# Patient Record
Sex: Male | Born: 1960
Health system: Southern US, Community
[De-identification: ages and names within clinical notes are randomized; demographics above are authoritative.]

## PROBLEM LIST (undated history)

## (undated) DIAGNOSIS — K219 Gastro-esophageal reflux disease without esophagitis: Secondary | ICD-10-CM

## (undated) DIAGNOSIS — Z8719 Personal history of other diseases of the digestive system: Secondary | ICD-10-CM

## (undated) DIAGNOSIS — E785 Hyperlipidemia, unspecified: Secondary | ICD-10-CM

## (undated) DIAGNOSIS — N200 Calculus of kidney: Secondary | ICD-10-CM

## (undated) DIAGNOSIS — K5790 Diverticulosis of intestine, part unspecified, without perforation or abscess without bleeding: Secondary | ICD-10-CM

## (undated) DIAGNOSIS — Z789 Other specified health status: Secondary | ICD-10-CM

## (undated) DIAGNOSIS — I10 Essential (primary) hypertension: Secondary | ICD-10-CM

## (undated) DIAGNOSIS — M109 Gout, unspecified: Secondary | ICD-10-CM

## (undated) DIAGNOSIS — N400 Enlarged prostate without lower urinary tract symptoms: Secondary | ICD-10-CM

## (undated) HISTORY — DX: Gastro-esophageal reflux disease without esophagitis: K21.9

## (undated) HISTORY — DX: Essential (primary) hypertension: I10

## (undated) HISTORY — PX: WRIST GANGLION EXCISION: SUR520

## (undated) HISTORY — DX: Personal history of other diseases of the digestive system: Z87.19

## (undated) HISTORY — DX: Benign prostatic hyperplasia without lower urinary tract symptoms: N40.0

## (undated) HISTORY — DX: Hyperlipidemia, unspecified: E78.5

## (undated) HISTORY — DX: Diverticulosis of intestine, part unspecified, without perforation or abscess without bleeding: K57.90

## (undated) HISTORY — DX: Gout, unspecified: M10.9

## (undated) HISTORY — DX: Calculus of kidney: N20.0

---

## 1992-08-16 HISTORY — PX: CYSTECTOMY: SUR359

## 1996-08-16 DIAGNOSIS — M109 Gout, unspecified: Secondary | ICD-10-CM

## 1996-08-16 DIAGNOSIS — Z8719 Personal history of other diseases of the digestive system: Secondary | ICD-10-CM

## 1996-08-16 HISTORY — DX: Personal history of other diseases of the digestive system: Z87.19

## 1996-08-16 HISTORY — DX: Gout, unspecified: M10.9

## 1997-08-16 HISTORY — PX: LITHOTRIPSY: SUR834

## 1997-10-24 ENCOUNTER — Encounter: Payer: Self-pay | Admitting: Family Medicine

## 1997-10-25 ENCOUNTER — Encounter: Payer: Self-pay | Admitting: Family Medicine

## 1997-10-25 LAB — CONVERTED CEMR LAB
Blood Glucose, Fasting: 85 mg/dL
WBC, blood: 6.8 10*3/uL

## 1998-12-15 DIAGNOSIS — E785 Hyperlipidemia, unspecified: Secondary | ICD-10-CM

## 1998-12-15 HISTORY — DX: Hyperlipidemia, unspecified: E78.5

## 1998-12-19 ENCOUNTER — Encounter: Payer: Self-pay | Admitting: Family Medicine

## 2000-04-07 ENCOUNTER — Encounter: Payer: Self-pay | Admitting: Family Medicine

## 2000-04-07 LAB — CONVERTED CEMR LAB: Blood Glucose, Fasting: 92 mg/dL

## 2001-05-04 ENCOUNTER — Encounter: Payer: Self-pay | Admitting: Family Medicine

## 2001-05-04 LAB — CONVERTED CEMR LAB
RBC count: 5.08 10*6/uL
WBC, blood: 7.8 10*3/uL

## 2003-04-09 ENCOUNTER — Encounter: Payer: Self-pay | Admitting: Family Medicine

## 2003-04-09 LAB — CONVERTED CEMR LAB: Blood Glucose, Fasting: 95 mg/dL

## 2004-05-25 ENCOUNTER — Encounter: Payer: Self-pay | Admitting: Family Medicine

## 2005-07-30 ENCOUNTER — Ambulatory Visit: Payer: Self-pay | Admitting: Family Medicine

## 2005-09-06 ENCOUNTER — Ambulatory Visit: Payer: Self-pay | Admitting: Family Medicine

## 2005-09-10 ENCOUNTER — Ambulatory Visit: Payer: Self-pay | Admitting: Family Medicine

## 2006-07-28 ENCOUNTER — Ambulatory Visit: Payer: Self-pay | Admitting: Family Medicine

## 2006-11-22 ENCOUNTER — Ambulatory Visit: Payer: Self-pay | Admitting: Family Medicine

## 2006-11-22 LAB — CONVERTED CEMR LAB
AST: 16 units/L (ref 0–37)
Alkaline Phosphatase: 44 units/L (ref 39–117)
CO2: 33 meq/L — ABNORMAL HIGH (ref 19–32)
Chloride: 110 meq/L (ref 96–112)
Creatinine, Ser: 1 mg/dL (ref 0.4–1.5)
HDL: 39.6 mg/dL (ref >39.0)
Potassium: 4.7 meq/L (ref 3.5–5.1)
Sodium: 148 meq/L — ABNORMAL HIGH (ref 135–145)
TSH: 1.1 microintl units/mL (ref 0.35–5.50)
Total Bilirubin: 1 mg/dL (ref 0.3–1.2)
Total Protein: 6.4 g/dL (ref 6.0–8.3)
Triglycerides: 127 mg/dL (ref 0–149)
Uric Acid, Serum: 4.6 mg/dL (ref 2.4–7.0)

## 2006-11-23 ENCOUNTER — Ambulatory Visit: Payer: Self-pay | Admitting: Family Medicine

## 2007-08-23 ENCOUNTER — Ambulatory Visit: Payer: Self-pay | Admitting: Family Medicine

## 2007-08-23 DIAGNOSIS — K219 Gastro-esophageal reflux disease without esophagitis: Secondary | ICD-10-CM

## 2007-08-23 DIAGNOSIS — K449 Diaphragmatic hernia without obstruction or gangrene: Secondary | ICD-10-CM

## 2007-08-28 ENCOUNTER — Encounter: Payer: Self-pay | Admitting: Family Medicine

## 2007-08-28 DIAGNOSIS — M109 Gout, unspecified: Secondary | ICD-10-CM

## 2007-08-28 DIAGNOSIS — L259 Unspecified contact dermatitis, unspecified cause: Secondary | ICD-10-CM

## 2007-08-28 DIAGNOSIS — E785 Hyperlipidemia, unspecified: Secondary | ICD-10-CM

## 2007-08-28 DIAGNOSIS — R7309 Other abnormal glucose: Secondary | ICD-10-CM | POA: Insufficient documentation

## 2008-05-29 ENCOUNTER — Ambulatory Visit: Payer: Self-pay | Admitting: Family Medicine

## 2008-05-29 LAB — CONVERTED CEMR LAB
Bilirubin, Direct: 0.1 mg/dL (ref 0.0–0.3)
CO2: 32 meq/L (ref 19–32)
Calcium: 9 mg/dL (ref 8.4–10.5)
Creatinine, Ser: 0.8 mg/dL (ref 0.4–1.5)
Creatinine,U: 212.5 mg/dL
GFR calc Af Amer: 133 mL/min
GFR calc non Af Amer: 110 mL/min
HDL: 35.6 mg/dL — ABNORMAL LOW (ref >39.0)
LDL Cholesterol: 106 mg/dL — ABNORMAL HIGH (ref 0–99)
Microalb Creat Ratio: 3.3 mg/g (ref 0.0–30.0)
Microalb, Ur: 0.7 mg/dL (ref 0.0–1.9)
Sodium: 144 meq/L (ref 135–145)
TSH: 0.83 microintl units/mL (ref 0.35–5.50)
Total Bilirubin: 0.8 mg/dL (ref 0.3–1.2)
Total CHOL/HDL Ratio: 4.5
Triglycerides: 99 mg/dL (ref 0–149)
Uric Acid, Serum: 4.4 mg/dL (ref 4.0–7.8)

## 2008-06-03 ENCOUNTER — Ambulatory Visit: Payer: Self-pay | Admitting: Family Medicine

## 2009-08-18 ENCOUNTER — Ambulatory Visit: Payer: Self-pay | Admitting: Family Medicine

## 2010-03-24 ENCOUNTER — Encounter (INDEPENDENT_AMBULATORY_CARE_PROVIDER_SITE_OTHER): Payer: Self-pay | Admitting: *Deleted

## 2010-09-17 NOTE — Letter (Signed)
Summary: Nadara Eaton letter  [place] at [facility]  [address] [place], [place] [address]   Phone: [phone]  Fax: [phone]       03/24/2010 MRN: [id number]  Timothy Landry [address] [place], [place]  [address]  Dear Mr. [facility] [place], and [facility] announce the retirement of Arta Silence, M.D., from full-time practice at the [facility] office effective February 12, 2010 and his plans of returning part-time.  It is important to Dr. Hetty Ely and to our practice that you understand that [facility] has seven physicians in our office for your health care needs.  We will continue to offer the same exceptional care that you have today.    Dr. Hetty Ely has spoken to many of you about his plans for retirement and returning part-time in the fall.   We will continue to work with you through the transition to schedule appointments for you in the office and meet the high standards that [place] is committed to.   Again, it is with great pleasure that we share the news that Dr. Hetty Ely will return to [facility] in October of 2011 with a reduced schedule.    If you have any questions, or would like to request an appointment with one of our physicians, please call us at [phone] and press the option for Scheduling an appointment.  We take pleasure in providing you with excellent patient care and look forward to seeing you at your next office visit.  Our [facility] Physicians are:  Tillman Abide, M.D. Laurita Quint, M.D. Roxy Manns, M.D. Kerby Nora, M.D. Hannah Beat, M.D. Ruthe Mannan, M.D. We proudly welcomed Raechel Ache, M.D. and Eustaquio Boyden, M.D. to the practice in July/August 2011.  Sincerely,  [facility]

## 2010-09-17 NOTE — Assessment & Plan Note (Signed)
Summary: MEDICATION REFILL CYD   Vital Signs:  Patient profile:   50 year old male Weight:      212 pounds BMI:     31.42 Temp:     98.3 degrees F oral Pulse rate:   60 / minute Pulse rhythm:   regular BP sitting:   120 / 100  (left arm) Cuff size:   regular  Vitals Entered By: Linde Gillis CMA Duncan Dull) (August 18, 2009 2:12 PM) CC: medication refill   History of Present Illness: Timothy Landry is a 50 y/o caucasian man who presents today for a refill of Allopurinol.  He states that his gout is well controlled and that he has had no recent episodes.  No complaints with any side effects from medications and no other medical complaints. He has not had Comp Exam in over aa year. He feels well.  Problems Prior to Update: 1)  Health Maintenance Exam  (ICD-V70.0) 2)  Hyperglycemia  (ICD-790.29) 3)  Ganglion Cyst, Wrist, Left Removed  (ICD-727.41) 4)  Eczema  (ICD-692.9) 5)  Hyperlipidemia  (ICD-272.4) 6)  Gout  (ICD-274.9) 7)  Hiatal Hernia With Reflux  (ICD-553.3)  Medications Prior to Update: 1)  Protonix 40 Mg  Tbec (Pantoprazole Sodium) .Marland Kitchen.. 1 Daily By Mouth 2)  Allopurinol 300 Mg  Tabs (Allopurinol) .Marland Kitchen.. 1 Daily By Mouth  Allergies (verified): No Known Drug Allergies  Physical Exam  General:  Well-developed,well-nourished,in no acute distress; alert,appropriate and cooperative throughout examination Head:  Normocephalic and atraumatic without obvious abnormalities. No apparent alopecia or balding. Eyes:  Conjunctiva clear bilaterally.  Ears:  External ear exam shows no significant lesions or deformities.  Otoscopic examination reveals clear canals, tympanic membranes are intact bilaterally without bulging, retraction, inflammation or discharge. Hearing is grossly normal bilaterally. Nose:  External nasal examination shows no deformity or inflammation. Nasal mucosa are pink and moist without lesions or exudates. Mouth:  Oral mucosa and oropharynx without lesions or exudates.   Teeth in good repair. Lungs:  Normal respiratory effort, chest expands symmetrically. Lungs are clear to auscultation, no crackles or wheezes. Heart:  Normal rate and regular rhythm. S1 and S2 normal without gallop, murmur, click, rub or other extra sounds. Extremities:  No clubbing, cyanosis, edema, or deformity noted with normal full range of motion of all joints.     Impression & Recommendations:  Problem # 1:  GOUT (ICD-274.9) Assessment Unchanged  Stable. Will recheck U>A in the future. His updated medication list for this problem includes:    Allopurinol 300 Mg Tabs (Allopurinol) .Marland Kitchen... 1 daily by mouth  Elevate extremity; warm compresses, symptomatic relief and medication as directed.   Complete Medication List: 1)  Allopurinol 300 Mg Tabs (Allopurinol) .Marland Kitchen.. 1 daily by mouth  Patient Instructions: 1)  RTC for Comp Exam prior to 01/2010, labs prior. Prescriptions: ALLOPURINOL 300 MG  TABS (ALLOPURINOL) 1 DAILY BY MOUTH  #90 x 4   Entered and Authorized by:   Shaune Leeks MD   Signed by:   Shaune Leeks MD on 08/18/2009   Method used:   Print then Give to Patient   RxID:   [id number]   Current Allergies (reviewed today): No known allergies

## 2010-11-23 ENCOUNTER — Other Ambulatory Visit: Payer: Self-pay | Admitting: *Deleted

## 2010-11-23 MED ORDER — ALLOPURINOL 300 MG PO TABS: 300.0000 mg | ORAL_TABLET | Freq: Every day | ORAL | Status: DC

## 2010-12-21 ENCOUNTER — Other Ambulatory Visit: Payer: Self-pay | Admitting: Family Medicine

## 2011-01-12 ENCOUNTER — Other Ambulatory Visit: Payer: Self-pay | Admitting: *Deleted

## 2011-01-12 MED ORDER — ALLOPURINOL 300 MG PO TABS: 300.0000 mg | ORAL_TABLET | Freq: Every day | ORAL | Status: DC

## 2011-01-12 NOTE — Telephone Encounter (Signed)
Will need to be seen before next refill. Aug PE will suffice.

## 2011-01-12 NOTE — Telephone Encounter (Signed)
Received faxed refill request from pharmacy to change from 30 day supply to a 90 day supply. Please verify dispense amount and no refills until seen at next appointment which is scheduled in July.

## 2011-01-12 NOTE — Telephone Encounter (Signed)
Will approve 90/0 but not again until seen. If he cancels or no shows, will need to be seen before refill.

## 2011-01-21 ENCOUNTER — Other Ambulatory Visit: Payer: Self-pay | Admitting: Family Medicine

## 2011-01-21 DIAGNOSIS — Z125 Encounter for screening for malignant neoplasm of prostate: Secondary | ICD-10-CM

## 2011-01-21 DIAGNOSIS — E785 Hyperlipidemia, unspecified: Secondary | ICD-10-CM

## 2011-01-21 DIAGNOSIS — M109 Gout, unspecified: Secondary | ICD-10-CM

## 2011-01-21 DIAGNOSIS — R7309 Other abnormal glucose: Secondary | ICD-10-CM

## 2011-02-01 ENCOUNTER — Other Ambulatory Visit (INDEPENDENT_AMBULATORY_CARE_PROVIDER_SITE_OTHER): Payer: BC Managed Care – PPO

## 2011-02-01 DIAGNOSIS — E785 Hyperlipidemia, unspecified: Secondary | ICD-10-CM

## 2011-02-01 DIAGNOSIS — Z125 Encounter for screening for malignant neoplasm of prostate: Secondary | ICD-10-CM

## 2011-02-01 DIAGNOSIS — M109 Gout, unspecified: Secondary | ICD-10-CM

## 2011-02-01 DIAGNOSIS — R7309 Other abnormal glucose: Secondary | ICD-10-CM

## 2011-02-01 LAB — CBC WITH DIFFERENTIAL/PLATELET
Basophils Relative: 0.4 % (ref 0.0–3.0)
Eosinophils Absolute: 0.1 10*3/uL (ref 0.0–0.7)
MCHC: 33.9 g/dL (ref 30.0–36.0)
MCV: 93.2 fl (ref 78.0–100.0)
Monocytes Absolute: 0.3 10*3/uL (ref 0.1–1.0)
Neutrophils Relative %: 58.9 % (ref 43.0–77.0)
RBC: 4.82 Mil/uL (ref 4.22–5.81)

## 2011-02-01 LAB — MICROALBUMIN / CREATININE URINE RATIO
Creatinine,U: 149 mg/dL
Microalb, Ur: 0.9 mg/dL (ref 0.0–1.9)

## 2011-02-01 LAB — LIPID PANEL
Cholesterol: 194 mg/dL (ref 0–200)
LDL Cholesterol: 124 mg/dL — ABNORMAL HIGH (ref 0–99)
Triglycerides: 152 mg/dL — ABNORMAL HIGH (ref 0.0–149.0)
VLDL: 30.4 mg/dL (ref 0.0–40.0)

## 2011-02-01 LAB — RENAL FUNCTION PANEL
Glucose, Bld: 91 mg/dL (ref 70–99)
Phosphorus: 2.8 mg/dL (ref 2.3–4.6)
Potassium: 4.3 mEq/L (ref 3.5–5.1)
Sodium: 142 mEq/L (ref 135–145)

## 2011-02-01 LAB — HEPATIC FUNCTION PANEL
Albumin: 4.2 g/dL (ref 3.5–5.2)
Total Protein: 6.8 g/dL (ref 6.0–8.3)

## 2011-02-03 ENCOUNTER — Encounter: Payer: Self-pay | Admitting: Family Medicine

## 2011-02-20 ENCOUNTER — Encounter: Payer: Self-pay | Admitting: Family Medicine

## 2011-03-03 ENCOUNTER — Encounter: Payer: Self-pay | Admitting: Family Medicine

## 2011-03-03 ENCOUNTER — Ambulatory Visit (INDEPENDENT_AMBULATORY_CARE_PROVIDER_SITE_OTHER): Payer: BC Managed Care – PPO | Admitting: Family Medicine

## 2011-03-03 ENCOUNTER — Ambulatory Visit: Payer: BC Managed Care – PPO | Admitting: Family Medicine

## 2011-03-03 VITALS — BP 118/70 | HR 64 | Temp 98.5°F | Ht 69.0 in | Wt 212.8 lb

## 2011-03-03 DIAGNOSIS — Z0000 Encounter for general adult medical examination without abnormal findings: Secondary | ICD-10-CM

## 2011-03-03 DIAGNOSIS — R7309 Other abnormal glucose: Secondary | ICD-10-CM

## 2011-03-03 DIAGNOSIS — K449 Diaphragmatic hernia without obstruction or gangrene: Secondary | ICD-10-CM

## 2011-03-03 DIAGNOSIS — E785 Hyperlipidemia, unspecified: Secondary | ICD-10-CM

## 2011-03-03 DIAGNOSIS — M109 Gout, unspecified: Secondary | ICD-10-CM

## 2011-03-03 DIAGNOSIS — L259 Unspecified contact dermatitis, unspecified cause: Secondary | ICD-10-CM

## 2011-03-03 NOTE — Assessment & Plan Note (Signed)
Minimally elevated in the past, normal this year. Cont to avoid sweets and carbs.

## 2011-03-03 NOTE — Assessment & Plan Note (Signed)
Stable

## 2011-03-03 NOTE — Progress Notes (Signed)
  Subjective:    Patient ID: Timothy Landry, male    DOB: 1961-04-30, 50 y.o.   MRN: [id number]  HPI Pt here for Comp Exam, last done approx 11/2 yrs ago. He has no complaints and is feeling well.    Review of Systems  Constitutional: Negative for fever, chills, diaphoresis, appetite change, fatigue and unexpected weight change.  HENT: Negative for hearing loss, ear pain, tinnitus and ear discharge.   Eyes: Negative for pain, discharge and visual disturbance.  Respiratory: Negative for cough, shortness of breath and wheezing.   Cardiovascular: Negative for chest pain and palpitations.       No SOB w/ exertion  Gastrointestinal: Negative for nausea, vomiting, abdominal pain, diarrhea, constipation and blood in stool.       No heartburn or swallowing problems.  Genitourinary: Negative for dysuria, frequency and difficulty urinating.       No nocturia  Musculoskeletal: Negative for myalgias, back pain and arthralgias.  Skin: Negative for rash.       No itching but some dryness.   Neurological: Negative for tremors and numbness.       No tingling or balance problems.  Hematological: Negative for adenopathy. Does not bruise/bleed easily.  Psychiatric/Behavioral: Negative for dysphoric mood and agitation.       Objective:   Physical Exam  Constitutional: He is oriented to person, place, and time. He appears well-developed and well-nourished. No distress.  HENT:  Head: Normocephalic and atraumatic.  Right Ear: External ear normal.  Left Ear: External ear normal.  Nose: Nose normal.  Mouth/Throat: Oropharynx is clear and moist.  Eyes: Conjunctivae and EOM are normal. Pupils are equal, round, and reactive to light. Right eye exhibits no discharge. Left eye exhibits no discharge. No scleral icterus.  Neck: Normal range of motion. Neck supple. No thyromegaly present.  Cardiovascular: Normal rate, regular rhythm, normal heart sounds and intact distal pulses.   No murmur  heard. Pulmonary/Chest: Effort normal and breath sounds normal. No respiratory distress. He has no wheezes.  Abdominal: Soft. Bowel sounds are normal. He exhibits no distension and no mass. There is no tenderness. There is no rebound and no guarding.  Genitourinary: Penis normal.       No hernias felt  Musculoskeletal: Normal range of motion. He exhibits no edema.  Lymphadenopathy:    He has no cervical adenopathy.  Neurological: He is alert and oriented to person, place, and time. Coordination normal.  Skin: Skin is warm and dry. No rash noted. He is not diaphoretic.  Psychiatric: He has a normal mood and affect. His behavior is normal. Judgment and thought content normal.          Assessment & Plan:  HMPE

## 2011-03-03 NOTE — Assessment & Plan Note (Signed)
Uric Acid 4.4, continue Allopurinol.

## 2011-03-03 NOTE — Patient Instructions (Signed)
Call after the first of the year for appt to meet new doctor for PE this time next year.

## 2011-03-03 NOTE — Assessment & Plan Note (Signed)
Normalized. Cont to watch sweet intake.

## 2011-03-03 NOTE — Patient Instructions (Signed)
Refer for colonoscopy Call for appt after the first of the year for PE with new  Doctor.

## 2011-03-03 NOTE — Assessment & Plan Note (Signed)
Acceptable. Discussed diet to get nos even better. Lab Results  Component Value Date   CHOL 194 02/01/2011   CHOL 161 05/29/2008   CHOL 182 11/22/2006   Lab Results  Component Value Date   HDL 39.60 02/01/2011   HDL 16.1* 05/29/2008   HDL 39.6 11/22/2006   Lab Results  Component Value Date   LDLCALC 124* 02/01/2011   LDLCALC 106* 05/29/2008   LDLCALC 117* 11/22/2006   Lab Results  Component Value Date   TRIG 152.0* 02/01/2011   TRIG 99 05/29/2008   TRIG 127 11/22/2006   Lab Results  Component Value Date   CHOLHDL 5 02/01/2011   CHOLHDL 4.5 CALC 05/29/2008   CHOLHDL 4.6 CALC 11/22/2006   No results found for this basename: LDLDIRECT

## 2011-03-03 NOTE — Progress Notes (Signed)
  Subjective:    Patient ID: Timothy Landry, male    DOB: 1961-04-12, 50 y.o.   MRN: [id number]  HPI Pt here for Comp Exam after being seen a few months ago after not being seen for quite a while. He has no complaints. He had a bite on the left lateral buttock that now is discolored. He has aches and pains but generally feels well. He had stress getting here today which is probably part of the BP higher than last time.    Review of Systems  Constitutional: Negative for fever, chills, diaphoresis, appetite change, fatigue and unexpected weight change.  HENT: Negative for hearing loss, ear pain, tinnitus and ear discharge.   Eyes: Negative for pain, discharge and visual disturbance.       Developing presbyopia.  Respiratory: Negative for cough, shortness of breath and wheezing.   Cardiovascular: Negative for chest pain and palpitations.       No SOB w/ exertion  Gastrointestinal: Negative for nausea, vomiting, abdominal pain, diarrhea, constipation and blood in stool.       No heartburn or swallowing problems.  Genitourinary: Negative for dysuria, frequency and difficulty urinating.       No nocturia  Musculoskeletal: Negative for myalgias and back pain. Arthralgias: mild.       Occasa backache and stiffness. Pulled a muscle in his back the first of the year.  Skin: Negative for rash.       No itching or dryness.  Neurological: Negative for tremors and numbness.       No tingling or balance problems.  Hematological: Negative for adenopathy. Does not bruise/bleed easily.  Psychiatric/Behavioral: Negative for dysphoric mood and agitation.       Objective:   Physical Exam  Constitutional: He is oriented to person, place, and time. He appears well-developed and well-nourished. No distress.  HENT:  Head: Normocephalic and atraumatic.  Right Ear: External ear normal.  Left Ear: External ear normal.  Nose: Nose normal.  Mouth/Throat: Oropharynx is clear and moist.  Eyes: Conjunctivae and  EOM are normal. Pupils are equal, round, and reactive to light. Right eye exhibits no discharge. Left eye exhibits no discharge. No scleral icterus.  Neck: Normal range of motion. Neck supple. No thyromegaly present.  Cardiovascular: Normal rate, regular rhythm, normal heart sounds and intact distal pulses.   No murmur heard. Pulmonary/Chest: Effort normal and breath sounds normal. No respiratory distress. He has no wheezes.  Abdominal: Soft. Bowel sounds are normal. He exhibits no distension and no mass. There is no tenderness. There is no rebound and no guarding.  Genitourinary: Penis normal. Guaiac negative stool.       No hernias felt.  Musculoskeletal: Normal range of motion. He exhibits no edema.  Lymphadenopathy:    He has no cervical adenopathy.  Neurological: He is alert and oriented to person, place, and time. Coordination normal.  Skin: Skin is warm and dry. No rash noted. He is not diaphoretic.  Psychiatric: He has a normal mood and affect. His behavior is normal. Judgment and thought content normal.          Assessment & Plan:  HMPE  Refer for colonoscopy

## 2011-03-03 NOTE — Assessment & Plan Note (Signed)
Lesion just above left eye is not rosacea or psoriasis. AM not sure what it is but appears benign. Let me know if worsens and will refer to derm if desired.

## 2011-03-03 NOTE — Assessment & Plan Note (Signed)
LDL elevated but within striking distance of his goal at 130 if he restricts fatty foods carefully. Discussed.

## 2011-03-03 NOTE — Assessment & Plan Note (Signed)
Treat episodically. Not often enough to discuss chronic therapy.

## 2011-07-13 ENCOUNTER — Other Ambulatory Visit: Payer: Self-pay | Admitting: *Deleted

## 2011-07-13 MED ORDER — ALLOPURINOL 300 MG PO TABS: 300.0000 mg | ORAL_TABLET | Freq: Every day | ORAL | Status: DC

## 2011-07-13 NOTE — Telephone Encounter (Signed)
Received faxed refill request from pharmacy. Is it okay to refill medication? 

## 2012-01-07 ENCOUNTER — Other Ambulatory Visit: Payer: Self-pay | Admitting: Family Medicine

## 2012-01-11 ENCOUNTER — Other Ambulatory Visit: Payer: Self-pay

## 2012-01-11 NOTE — Telephone Encounter (Signed)
Pt scheduled CPX Dr Reece Agar 03/03/12. Pt request refill allopurinol. Lalla Brothers at [facility] rx ready for pick up. Pt notified ready while on phone.

## 2012-02-24 ENCOUNTER — Other Ambulatory Visit: Payer: Self-pay | Admitting: Family Medicine

## 2012-02-24 DIAGNOSIS — R7309 Other abnormal glucose: Secondary | ICD-10-CM

## 2012-02-24 DIAGNOSIS — Z125 Encounter for screening for malignant neoplasm of prostate: Secondary | ICD-10-CM

## 2012-02-24 DIAGNOSIS — M109 Gout, unspecified: Secondary | ICD-10-CM

## 2012-02-24 DIAGNOSIS — E785 Hyperlipidemia, unspecified: Secondary | ICD-10-CM

## 2012-02-28 ENCOUNTER — Other Ambulatory Visit (INDEPENDENT_AMBULATORY_CARE_PROVIDER_SITE_OTHER): Payer: BC Managed Care – PPO

## 2012-02-28 DIAGNOSIS — M109 Gout, unspecified: Secondary | ICD-10-CM

## 2012-02-28 DIAGNOSIS — R7309 Other abnormal glucose: Secondary | ICD-10-CM

## 2012-02-28 DIAGNOSIS — E785 Hyperlipidemia, unspecified: Secondary | ICD-10-CM

## 2012-02-28 DIAGNOSIS — Z125 Encounter for screening for malignant neoplasm of prostate: Secondary | ICD-10-CM

## 2012-02-28 LAB — BASIC METABOLIC PANEL
Calcium: 9.3 mg/dL (ref 8.4–10.5)
GFR: 100.51 mL/min (ref >60.00)
Sodium: 143 mEq/L (ref 135–145)

## 2012-02-28 LAB — LIPID PANEL
HDL: 43.8 mg/dL (ref >39.00)
Triglycerides: 75 mg/dL (ref 0.0–149.0)

## 2012-02-28 LAB — PSA: PSA: 0.37 ng/mL (ref 0.10–4.00)

## 2012-03-03 ENCOUNTER — Encounter: Payer: Self-pay | Admitting: Internal Medicine

## 2012-03-03 ENCOUNTER — Encounter: Payer: Self-pay | Admitting: Family Medicine

## 2012-03-03 ENCOUNTER — Ambulatory Visit (INDEPENDENT_AMBULATORY_CARE_PROVIDER_SITE_OTHER): Payer: BC Managed Care – PPO | Admitting: Family Medicine

## 2012-03-03 VITALS — BP 126/82 | HR 72 | Temp 98.1°F | Ht 70.0 in | Wt 201.0 lb

## 2012-03-03 DIAGNOSIS — Z0000 Encounter for general adult medical examination without abnormal findings: Secondary | ICD-10-CM | POA: Insufficient documentation

## 2012-03-03 DIAGNOSIS — E785 Hyperlipidemia, unspecified: Secondary | ICD-10-CM

## 2012-03-03 DIAGNOSIS — Z1211 Encounter for screening for malignant neoplasm of colon: Secondary | ICD-10-CM

## 2012-03-03 DIAGNOSIS — K449 Diaphragmatic hernia without obstruction or gangrene: Secondary | ICD-10-CM

## 2012-03-03 NOTE — Assessment & Plan Note (Signed)
Great control! congratulated and encouraged continued diet/lifestyle changes.

## 2012-03-03 NOTE — Assessment & Plan Note (Signed)
Reviewed preventative protocols and updated unless pt declined. Discussed healthy diet and lifestyle. Reviewed blood work in detail.

## 2012-03-03 NOTE — Progress Notes (Signed)
Subjective:    Patient ID: Timothy Landry, male    DOB: Nov 18, 1960, 51 y.o.   MRN: [id number]  HPI CC: CPE  No concerns today. Planning on 3 mi walk tomorrow with wife.  Wt Readings from Last 3 Encounters:  03/03/12 201 lb (91.173 kg)  03/03/11 212 lb 12 oz (96.503 kg)  03/03/11 212 lb 12 oz (96.503 kg)    Preventative: Last CPE last year. Colon screening - had to postpone colonoscopy.  Would like to schedule this year.  Some fmhx.  No blood in stool or urine. Prostate screening - discussed.  would like to defer today. Sunscreen use discussed. Tetanus 2009 Seat belt 100%  Caffeine: 1 mountain dew/day Lives with wife, mother in law, 1 puppy Occupation: works at [employer]: work, and out in yard.  no regular exercise Diet: good water, vegetables daily  Medications and allergies reviewed and updated in chart.  Past histories reviewed and updated if relevant as below. Patient Active Problem List  Diagnosis  . HYPERLIPIDEMIA  . GOUT  . HIATAL HERNIA WITH REFLUX  . ECZEMA  . HYPERGLYCEMIA  . Healthcare maintenance   Past Medical History  Diagnosis Date  . Gout 08/1996  . Hyperlipidemia 12/1998  . History of hiatal hernia 08/1996    Barium Swallow HH, Mod . Size  . GERD (gastroesophageal reflux disease)    Past Surgical History  Procedure Date  . Lithotripsy 1999  . Cystectomy 1994    cystectomy right foot and gluteal area   History  Substance Use Topics  . Smoking status: Never Smoker   . Smokeless tobacco: Current User    Types: Snuff   Comment: 1 can/day  . Alcohol Use: No   Family History  Problem Relation Age of Onset  . Hypertension Mother   . Diabetes Father   . Hypertension Father   . Kidney disease Father   . Hypertension Sister   . Cancer Neg Hx   . Coronary artery disease Neg Hx   . Stroke Neg Hx    No Known Allergies Current Outpatient Prescriptions on File Prior to Visit  Medication Sig Dispense Refill  . allopurinol  (ZYLOPRIM) 300 MG tablet TAKE 1 TABLET BY MOUTH EVERY DAY  90 tablet  0  . omeprazole (PRILOSEC OTC) 20 MG tablet Take 20 mg by mouth daily.           Review of Systems  Constitutional: Negative for fever, chills, activity change, appetite change, fatigue and unexpected weight change.  HENT: Negative for hearing loss and neck pain.   Eyes: Negative for visual disturbance.  Respiratory: Negative for cough, chest tightness, shortness of breath and wheezing.   Cardiovascular: Negative for chest pain, palpitations and leg swelling.  Gastrointestinal: Negative for nausea, vomiting, abdominal pain, diarrhea, constipation, blood in stool and abdominal distention.  Genitourinary: Negative for hematuria and difficulty urinating.  Musculoskeletal: Negative for myalgias and arthralgias.  Skin: Negative for rash.  Neurological: Negative for dizziness, seizures, syncope and headaches.  Hematological: Does not bruise/bleed easily.  Psychiatric/Behavioral: Negative for dysphoric mood. The patient is not nervous/anxious.        Objective:   Physical Exam  Nursing note and vitals reviewed. Constitutional: He is oriented to person, place, and time. He appears well-developed and well-nourished. No distress.  HENT:  Head: Normocephalic and atraumatic.  Right Ear: External ear normal.  Left Ear: External ear normal.  Nose: Nose normal.  Mouth/Throat: Oropharynx is clear and moist. No oropharyngeal exudate.  Eyes: Conjunctivae and EOM are normal. Pupils are equal, round, and reactive to light. No scleral icterus.  Neck: Normal range of motion. Neck supple. No thyromegaly present.  Cardiovascular: Normal rate, regular rhythm, normal heart sounds and intact distal pulses.   No murmur heard. Pulses:      Radial pulses are 2+ on the right side, and 2+ on the left side.  Pulmonary/Chest: Effort normal and breath sounds normal. No respiratory distress. He has no wheezes. He has no rales.  Abdominal: Soft.  Bowel sounds are normal. He exhibits no distension and no mass. There is no tenderness. There is no rebound and no guarding.  Musculoskeletal: Normal range of motion. He exhibits no edema.  Lymphadenopathy:    He has no cervical adenopathy.  Neurological: He is alert and oriented to person, place, and time.       CN grossly intact, station and gait intact  Skin: Skin is warm and dry. No rash noted.  Psychiatric: He has a normal mood and affect. His behavior is normal. Judgment and thought content normal.       Assessment & Plan:

## 2012-03-03 NOTE — Patient Instructions (Signed)
Good to meet you today, call us with questions. Pass by Marion's office today to schedule colonoscopy. Great job on healthy changes!

## 2012-03-03 NOTE — Assessment & Plan Note (Signed)
Controlled on daily otc omeprazole

## 2012-04-11 ENCOUNTER — Other Ambulatory Visit: Payer: Self-pay | Admitting: Family Medicine

## 2012-04-16 DIAGNOSIS — K5790 Diverticulosis of intestine, part unspecified, without perforation or abscess without bleeding: Secondary | ICD-10-CM

## 2012-04-16 HISTORY — PX: COLONOSCOPY: SHX174

## 2012-04-16 HISTORY — DX: Diverticulosis of intestine, part unspecified, without perforation or abscess without bleeding: K57.90

## 2012-04-24 ENCOUNTER — Ambulatory Visit (AMBULATORY_SURGERY_CENTER): Payer: BC Managed Care – PPO | Admitting: *Deleted

## 2012-04-24 VITALS — Ht 69.5 in | Wt 203.6 lb

## 2012-04-24 DIAGNOSIS — Z1211 Encounter for screening for malignant neoplasm of colon: Secondary | ICD-10-CM

## 2012-04-24 MED ORDER — SUPREP BOWEL PREP KIT 17.5-3.13-1.6 GM/177ML PO SOLN: ORAL | Status: DC

## 2012-04-24 MED ORDER — MOVIPREP 100 G PO SOLR: ORAL | Status: DC

## 2012-04-25 ENCOUNTER — Encounter: Payer: Self-pay | Admitting: Internal Medicine

## 2012-05-08 ENCOUNTER — Encounter: Payer: Self-pay | Admitting: Internal Medicine

## 2012-05-08 ENCOUNTER — Other Ambulatory Visit: Payer: Self-pay | Admitting: Gastroenterology

## 2012-05-08 ENCOUNTER — Ambulatory Visit (AMBULATORY_SURGERY_CENTER): Payer: BC Managed Care – PPO | Admitting: Internal Medicine

## 2012-05-08 VITALS — BP 117/74 | HR 58 | Temp 98.0°F | Resp 22 | Ht 69.5 in | Wt 203.0 lb

## 2012-05-08 DIAGNOSIS — Z1211 Encounter for screening for malignant neoplasm of colon: Secondary | ICD-10-CM

## 2012-05-08 DIAGNOSIS — D126 Benign neoplasm of colon, unspecified: Secondary | ICD-10-CM

## 2012-05-08 DIAGNOSIS — I499 Cardiac arrhythmia, unspecified: Secondary | ICD-10-CM

## 2012-05-08 MED ORDER — SODIUM CHLORIDE 0.9 % IV SOLN: 500.0000 mL | INTRAVENOUS | Status: DC

## 2012-05-08 NOTE — Op Note (Signed)
[facility] [address] [place], [address]   COLONOSCOPY PROCEDURE REPORT  PATIENT: Timothy Landry, Timothy Landry  MR#: [id number] BIRTHDATE: [birth year] , 51  yrs. old GENDER: Male ENDOSCOPIST: Beverley Fiedler, MD REFERRED QI:[id number], Wynona Canes PROCEDURE DATE:  05/08/2012 PROCEDURE:   Colonoscopy with cold biopsy polypectomy ASA CLASS:   Class I INDICATIONS:average risk screening and first colonoscopy. MEDICATIONS: MAC sedation, administered by CRNA and Propofol (Diprivan) 160 mg IV  DESCRIPTION OF PROCEDURE:   After the risks benefits and alternatives of the procedure were thoroughly explained, informed consent was obtained.  A digital rectal exam revealed no rectal mass.   The LB [id number]  endoscope was introduced through the anus and advanced to the cecum, which was identified by both the appendix and ileocecal valve. No adverse events experienced. The quality of the prep was Suprep good  The instrument was then slowly withdrawn as the colon was fully examined.   COLON FINDINGS: Three sessile polyps ranging between 3-77mm in size were found in the descending colon.  Polypectomy was performed with cold forceps.  All resections were complete and all polyp tissue was completely retrieved.   Moderate diverticulosis was noted in the ascending colon, descending colon, and sigmoid colon. Retroflexed views revealed no abnormalities. The time to cecum=5 minutes 25 seconds.  Withdrawal time=11 minutes 22 seconds.  The scope was withdrawn and the procedure completed. COMPLICATIONS: There were no complications.  ENDOSCOPIC IMPRESSION: 1.   Three sessile polyps ranging between 3-45mm in size were found in the descending colon; Polypectomy was performed with cold forceps 2.   Moderate diverticulosis was noted in the ascending colon, descending colon, and sigmoid colon  RECOMMENDATIONS: 1.  await pathology results 2.  High fiber diet 3.  If the polyps removed today are  proven to be adenomatous (pre-cancerous) polyps, you will need a colonoscopy in 3 years. Otherwise you should continue to follow colorectal cancer screening guidelines for "routine risk" patients with a colonoscopy in 10 years.  You will receive a letter within 1-2 weeks with the results of your biopsy as well as final recommendations.  Please call my office if you have not received a letter after 3 weeks.   eSigned:  Beverley Fiedler, MD 05/08/2012 8:59 AM   cc: Eustaquio Boyden MD and The Patient   PATIENT NAME:  [name], [name] MR#: [id number]

## 2012-05-08 NOTE — Patient Instructions (Addendum)
Findings:  Polyps, Diverticulosis  Recommendations:  High Fiber Diet  YOU HAD AN ENDOSCOPIC PROCEDURE TODAY AT THE [facility]: Refer to the procedure report that was given to you for any specific questions about what was found during the examination.  If the procedure report does not answer your questions, please call your gastroenterologist to clarify.  If you requested that your care partner not be given the details of your procedure findings, then the procedure report has been included in a sealed envelope for you to review at your convenience later.  YOU SHOULD EXPECT: Some feelings of bloating in the abdomen. Passage of more gas than usual.  Walking can help get rid of the air that was put into your GI tract during the procedure and reduce the bloating. If you had a lower endoscopy (such as a colonoscopy or flexible sigmoidoscopy) you may notice spotting of blood in your stool or on the toilet paper. If you underwent a bowel prep for your procedure, then you may not have a normal bowel movement for a few days.  DIET: Your first meal following the procedure should be a light meal and then it is ok to progress to your normal diet.  A half-sandwich or bowl of soup is an example of a good first meal.  Heavy or fried foods are harder to digest and may make you feel nauseous or bloated.  Likewise meals heavy in dairy and vegetables can cause extra gas to form and this can also increase the bloating.  Drink plenty of fluids but you should avoid alcoholic beverages for 24 hours.  ACTIVITY: Your care partner should take you home directly after the procedure.  You should plan to take it easy, moving slowly for the rest of the day.  You can resume normal activity the day after the procedure however you should NOT DRIVE or use heavy machinery for 24 hours (because of the sedation medicines used during the test).    SYMPTOMS TO REPORT IMMEDIATELY: A gastroenterologist can be reached at any hour.   During normal business hours, 8:30 AM to 5:00 PM Monday through Friday, call [phone].  After hours and on weekends, please call the GI answering service at [phone] who will take a message and have the physician on call contact you.   Following lower endoscopy (colonoscopy or flexible sigmoidoscopy):  Excessive amounts of blood in the stool  Significant tenderness or worsening of abdominal pains  Swelling of the abdomen that is new, acute  Fever of 100F or higher  Following upper endoscopy (EGD)  Vomiting of blood or coffee ground material  New chest pain or pain under the shoulder blades  Painful or persistently difficult swallowing  New shortness of breath  Fever of 100F or higher  Black, tarry-looking stools  FOLLOW UP: If any biopsies were taken you will be contacted by phone or by letter within the next 1-3 weeks.  Call your gastroenterologist if you have not heard about the biopsies in 3 weeks.  Our staff will call the home number listed on your records the next business day following your procedure to check on you and address any questions or concerns that you may have at that time regarding the information given to you following your procedure. This is a courtesy call and so if there is no answer at the home number and we have not heard from you through the emergency physician on call, we will assume that you have returned to your  regular daily activities without incident.  SIGNATURES/CONFIDENTIALITY: You and/or your care partner have signed paperwork which will be entered into your electronic medical record.  These signatures attest to the fact that that the information above on your After Visit Summary has been reviewed and is understood.  Full responsibility of the confidentiality of this discharge information lies with you and/or your care-partner.   Please follow all discharge instructions given to you by the recovery room nurse. If you have any questions or  problems after discharge please call one of the numbers listed above. You will receive a phone call in the am to see how you are doing and answer any questions you may have. Thank you for choosing [facility] for your health care needs.  

## 2012-05-08 NOTE — Progress Notes (Signed)
Patient did not experience any of the following events: a burn prior to discharge; a fall within the facility; wrong site/side/patient/procedure/implant event; or a hospital transfer or hospital admission upon discharge from the facility. (G8907) Patient did not have preoperative order for IV antibiotic SSI prophylaxis. (G8918)  

## 2012-05-09 ENCOUNTER — Telehealth: Payer: Self-pay

## 2012-05-09 NOTE — Telephone Encounter (Signed)
  Follow up Call-  Call back number 05/08/2012  Post procedure Call Back phone  # [phone] hm  Permission to leave phone message Yes     Patient questions:  Do you have a fever, pain , or abdominal swelling? no Pain Score  0 *  Have you tolerated food without any problems? yes  Have you been able to return to your normal activities? yes  Do you have any questions about your discharge instructions: Diet   no Medications  no Follow up visit  no  Do you have questions or concerns about your Care? no  Actions: * If pain score is 4 or above: No action needed, pain <4.  I spoke with the pt's mother-in-law and she said he was still asleep, but he did fine last night. Maw

## 2012-05-11 ENCOUNTER — Encounter: Payer: Self-pay | Admitting: Family Medicine

## 2012-05-15 ENCOUNTER — Encounter: Payer: Self-pay | Admitting: Internal Medicine

## 2012-05-15 ENCOUNTER — Encounter: Payer: Self-pay | Admitting: Family Medicine

## 2012-05-24 ENCOUNTER — Ambulatory Visit: Payer: BC Managed Care – PPO | Admitting: Cardiovascular Disease

## 2012-05-31 ENCOUNTER — Encounter: Payer: Self-pay | Admitting: Cardiovascular Disease

## 2012-05-31 ENCOUNTER — Ambulatory Visit (INDEPENDENT_AMBULATORY_CARE_PROVIDER_SITE_OTHER): Payer: BC Managed Care – PPO | Admitting: Cardiovascular Disease

## 2012-05-31 VITALS — BP 108/76 | HR 62 | Ht 69.5 in | Wt 202.4 lb

## 2012-05-31 DIAGNOSIS — K449 Diaphragmatic hernia without obstruction or gangrene: Secondary | ICD-10-CM

## 2012-05-31 DIAGNOSIS — I493 Ventricular premature depolarization: Secondary | ICD-10-CM | POA: Insufficient documentation

## 2012-05-31 DIAGNOSIS — I4949 Other premature depolarization: Secondary | ICD-10-CM

## 2012-05-31 DIAGNOSIS — M109 Gout, unspecified: Secondary | ICD-10-CM

## 2012-05-31 DIAGNOSIS — E785 Hyperlipidemia, unspecified: Secondary | ICD-10-CM

## 2012-05-31 NOTE — Assessment & Plan Note (Signed)
Continue H2 blocker and low carb diet

## 2012-05-31 NOTE — Assessment & Plan Note (Signed)
Diet Rx target LDL under 130

## 2012-05-31 NOTE — Assessment & Plan Note (Signed)
Uric acid elevated continue allopurinol

## 2012-05-31 NOTE — Progress Notes (Signed)
Patient ID: Timothy Landry, male   DOB: February 21, 1961, 51 y.o.   MRN: [id number] 51 yo healthy male.  Referred by GI.  Had some PVC;s during colonoscopy.  Reviewed rhythm strip.  With isolated unifocal PVC x 2. He is sedentary but drives a fork lift.  No chest pain palpitations dyspnea or syncope. Having colonoscopy due to age.  No polyps or pathology found.  Baseline ECG today reviewed and totally normal.  No history of valve disease murmur Nonsmoker.  No family history of congenital disease or syncope  ROS: Denies fever, malais, weight loss, blurry vision, decreased visual acuity, cough, sputum, SOB, hemoptysis, pleuritic pain, palpitaitons, heartburn, abdominal pain, melena, lower extremity edema, claudication, or rash.  All other systems reviewed and negative   General: Affect appropriate Healthy:  appears stated age HEENT: normal Neck supple with no adenopathy JVP normal no bruits no thyromegaly Lungs clear with no wheezing and good diaphragmatic motion Heart:  S1/S2 no murmur,rub, gallop or click PMI normal Abdomen: benighn, BS positve, no tenderness, no AAA no bruit.  No HSM or HJR Distal pulses intact with no bruits No edema Neuro non-focal Skin warm and dry No muscular weakness  Medications Current Outpatient Prescriptions  Medication Sig Dispense Refill  . allopurinol (ZYLOPRIM) 300 MG tablet Take 1 tablet (300 mg total) by mouth daily.  90 tablet  3  . omeprazole (PRILOSEC OTC) 20 MG tablet Take 20 mg by mouth daily.          Allergies Review of patient's allergies indicates no known allergies.  Family History: Family History  Problem Relation Age of Onset  . Hypertension Mother   . Diabetes Father   . Hypertension Father   . Kidney disease Father   . Hypertension Sister   . Cancer Neg Hx   . Coronary artery disease Neg Hx   . Stroke Neg Hx   . Colon cancer Neg Hx   . Esophageal cancer Neg Hx   . Rectal cancer Neg Hx   . Stomach cancer Neg Hx     Social  History: History   Social History  . Marital Status: Married    Spouse Name: N/A    Number of Children: N/A  . Years of Education: N/A   Occupational History  . Not on file.   Social History Main Topics  . Smoking status: Never Smoker   . Smokeless tobacco: Current User    Types: Snuff   Comment: 1 can/day  . Alcohol Use: Yes     rare beer  . Drug Use: No  . Sexually Active: Yes   Other Topics Concern  . Not on file   Social History Narrative   Caffeine: 1 mountain dew/dayLives with wife, mother in law, 1 puppyOccupation: works at [employer]: work, and out in yard.  no regular exerciseDiet: good water, vegetables daily    Electrocardiogram:  NSR rate 62 normal ECG  Assessment and Plan

## 2012-05-31 NOTE — Patient Instructions (Signed)
Your physician recommends that you schedule a follow-up appointment in: AS NEEDED  Your physician recommends that you continue on your current medications as directed. Please refer to the Current Medication list given to you today.  

## 2012-05-31 NOTE — Assessment & Plan Note (Signed)
Benign related to procedure.  Baseline ECG normal, with normal exam and no symptoms

## 2012-08-16 DIAGNOSIS — N200 Calculus of kidney: Secondary | ICD-10-CM

## 2012-08-16 HISTORY — DX: Calculus of kidney: N20.0

## 2012-08-16 HISTORY — PX: LITHOTRIPSY: SUR834

## 2012-12-13 ENCOUNTER — Emergency Department: Payer: Self-pay | Admitting: Emergency Medicine

## 2012-12-13 LAB — CBC
HCT: 47.9 % (ref 40.0–52.0)
HGB: 16.3 g/dL (ref 13.0–18.0)
MCH: 30.3 pg (ref 26.0–34.0)
Platelet: 201 10*3/uL (ref 150–440)
RBC: 5.38 10*6/uL (ref 4.40–5.90)
WBC: 13.8 10*3/uL — ABNORMAL HIGH (ref 3.8–10.6)

## 2012-12-13 LAB — URINALYSIS, COMPLETE
Bacteria: NONE SEEN
Glucose,UR: NEGATIVE mg/dL (ref 0–75)
Leukocyte Esterase: NEGATIVE
RBC,UR: 220 /HPF (ref 0–5)
Squamous Epithelial: 1

## 2012-12-13 LAB — COMPREHENSIVE METABOLIC PANEL
Alkaline Phosphatase: 66 U/L (ref 50–136)
BUN: 23 mg/dL — ABNORMAL HIGH (ref 7–18)
Calcium, Total: 9.7 mg/dL (ref 8.5–10.1)
Chloride: 109 mmol/L — ABNORMAL HIGH (ref 98–107)
Co2: 21 mmol/L (ref 21–32)
Creatinine: 1.32 mg/dL — ABNORMAL HIGH (ref 0.60–1.30)
EGFR (African American): >60
Glucose: 108 mg/dL — ABNORMAL HIGH (ref 65–99)
Osmolality: 287 (ref 275–301)
Potassium: 3.9 mmol/L (ref 3.5–5.1)
SGOT(AST): 26 U/L (ref 15–37)
SGPT (ALT): 20 U/L (ref 12–78)
Sodium: 142 mmol/L (ref 136–145)
Total Protein: 7.8 g/dL (ref 6.4–8.2)

## 2012-12-21 ENCOUNTER — Ambulatory Visit: Payer: Self-pay | Admitting: Urology

## 2013-03-01 ENCOUNTER — Ambulatory Visit: Payer: Self-pay | Admitting: Urology

## 2013-03-12 ENCOUNTER — Ambulatory Visit: Payer: Self-pay | Admitting: Urology

## 2013-04-08 ENCOUNTER — Other Ambulatory Visit: Payer: Self-pay | Admitting: Family Medicine

## 2013-04-09 ENCOUNTER — Other Ambulatory Visit: Payer: Self-pay | Admitting: Family Medicine

## 2013-04-09 MED ORDER — ALLOPURINOL 300 MG PO TABS: ORAL_TABLET | ORAL | Status: DC

## 2013-04-09 NOTE — Telephone Encounter (Signed)
Pt has physical scheduled for 04/26/13.

## 2013-04-26 ENCOUNTER — Encounter: Payer: Self-pay | Admitting: Family Medicine

## 2013-04-26 ENCOUNTER — Ambulatory Visit (INDEPENDENT_AMBULATORY_CARE_PROVIDER_SITE_OTHER): Payer: BC Managed Care – PPO | Admitting: Family Medicine

## 2013-04-26 VITALS — BP 102/80 | HR 65 | Temp 97.5°F | Ht 68.0 in | Wt 192.3 lb

## 2013-04-26 DIAGNOSIS — Z0000 Encounter for general adult medical examination without abnormal findings: Secondary | ICD-10-CM

## 2013-04-26 DIAGNOSIS — E785 Hyperlipidemia, unspecified: Secondary | ICD-10-CM

## 2013-04-26 NOTE — Assessment & Plan Note (Signed)
Preventative protocols reviewed and updated unless pt declined. Discussed healthy diet and lifestyle. Flu shot at work. 

## 2013-04-26 NOTE — Progress Notes (Signed)
Subjective:    Patient ID: Timothy Landry, male    DOB: 1960/09/29, 52 y.o.   MRN: [id number]  HPI CC: CPE  Recent kidney stone.  Preventative:  Last CPE last year.  Colon screening - colonoscopy benign polyps, rpt 10 yrs (Pyrtle).  Prostate screening - strong stream, no nocturia.  Would like to defer for now.  Recent nephrolithiasis s/p stent placement.  Has been on flomax recently, due for f/u with Dr. Evelene Croon. Tetanus 2009. Flu shot - at work  Sunscreen use dicussed. Seat belt 100%   Caffeine: 1 mountain dew/day  Lives with wife, mother in law, 1 puppy  Occupation: works at [employer]: work, and out in yard. no regular exercise  Diet: good water, vegetables daily  Medications and allergies reviewed and updated in chart.  Past histories reviewed and updated if relevant as below. Patient Active Problem List   Diagnosis Date Noted  . PVC (premature ventricular contraction) 05/31/2012  . Healthcare maintenance 03/03/2012  . HYPERLIPIDEMIA 08/28/2007  . GOUT 08/28/2007  . ECZEMA 08/28/2007  . HIATAL HERNIA WITH REFLUX 08/23/2007   Past Medical History  Diagnosis Date  . Gout 08/1996  . Hyperlipidemia 12/1998  . History of hiatal hernia 08/1996    Barium Swallow HH, Mod . Size  . GERD (gastroesophageal reflux disease)   . Diverticulosis 04/2012   Past Surgical History  Procedure Laterality Date  . Lithotripsy  1999  . Cystectomy  1994    cystectomy right foot and gluteal area  . Wrist ganglion excision    . Colonoscopy  04/2012    mod diverticulosis, 3 polyps - benign, rec rpt 10 yrs (Pyrtle)   History  Substance Use Topics  . Smoking status: Never Smoker   . Smokeless tobacco: Current User    Types: Snuff     Comment: 1 can/day  . Alcohol Use: Yes     Comment: rare beer   Family History  Problem Relation Age of Onset  . Hypertension Mother   . Diabetes Father   . Hypertension Father   . Kidney disease Father   . Hypertension Sister   .  Cancer Neg Hx   . Coronary artery disease Neg Hx   . Stroke Neg Hx   . Colon cancer Neg Hx   . Esophageal cancer Neg Hx   . Rectal cancer Neg Hx   . Stomach cancer Neg Hx    No Known Allergies Current Outpatient Prescriptions on File Prior to Visit  Medication Sig Dispense Refill  . allopurinol (ZYLOPRIM) 300 MG tablet TAKE 1 TABLET BY MOUTH EVERY DAY  30 tablet  0  . omeprazole (PRILOSEC OTC) 20 MG tablet Take 20 mg by mouth daily.         No current facility-administered medications on file prior to visit.     Review of Systems  Constitutional: Negative for fever, chills, activity change, appetite change, fatigue and unexpected weight change.  HENT: Negative for hearing loss and neck pain.   Eyes: Negative for visual disturbance.  Respiratory: Negative for cough, chest tightness, shortness of breath and wheezing.   Cardiovascular: Negative for chest pain, palpitations and leg swelling.  Gastrointestinal: Negative for nausea, vomiting, abdominal pain, diarrhea, constipation, blood in stool and abdominal distention.  Genitourinary: Negative for hematuria and difficulty urinating.  Musculoskeletal: Negative for myalgias and arthralgias.  Skin: Negative for rash.  Neurological: Negative for dizziness, seizures, syncope and headaches.  Hematological: Negative for adenopathy. Does not bruise/bleed easily.  Psychiatric/Behavioral: Negative for dysphoric mood. The patient is not nervous/anxious.        Objective:   Physical Exam  Nursing note and vitals reviewed. Constitutional: He is oriented to person, place, and time. He appears well-developed and well-nourished. No distress.  HENT:  Head: Normocephalic and atraumatic.  Mouth/Throat: Oropharynx is clear and moist. No oropharyngeal exudate.  Eyes: Conjunctivae and EOM are normal. Pupils are equal, round, and reactive to light. No scleral icterus.  Neck: Normal range of motion. Neck supple. No thyromegaly present.   Cardiovascular: Normal rate, regular rhythm, normal heart sounds and intact distal pulses.   No murmur heard. Pulses:      Radial pulses are 2+ on the right side, and 2+ on the left side.  Pulmonary/Chest: Effort normal and breath sounds normal. No respiratory distress. He has no wheezes. He has no rales.  Abdominal: Soft. Bowel sounds are normal. He exhibits no distension and no mass. There is no tenderness. There is no rebound and no guarding.  Musculoskeletal: Normal range of motion. He exhibits no edema.  Lymphadenopathy:    He has no cervical adenopathy.  Neurological: He is alert and oriented to person, place, and time.  CN grossly intact, station and gait intact  Skin: Skin is warm and dry. No rash noted.  Psychiatric: He has a normal mood and affect. His behavior is normal. Judgment and thought content normal.       Assessment & Plan:

## 2013-04-26 NOTE — Assessment & Plan Note (Signed)
Stable off meds. Good control last year - will recheck next year.

## 2013-04-26 NOTE — Patient Instructions (Addendum)
Good to see you today, call us with questions. No blood work today.

## 2013-10-15 ENCOUNTER — Telehealth: Payer: Self-pay | Admitting: *Deleted

## 2013-10-15 ENCOUNTER — Telehealth: Payer: Self-pay

## 2013-10-15 MED ORDER — ALLOPURINOL 300 MG PO TABS: ORAL_TABLET | ORAL | Status: DC

## 2013-10-15 NOTE — Telephone Encounter (Signed)
Pharmacist phoned wrong office

## 2013-10-15 NOTE — Telephone Encounter (Signed)
Megan with [facility] left v/m; pt requesting 90 day supply for allopurinol 300 mg taking one tab daily.Refill done.

## 2014-03-13 IMAGING — CR DG ABDOMEN 1V
1 series · 2 of 2 positions shown · non-contrast
Comparison: none

REASON FOR EXAM: Calculus
COMMENTS:

[Series 1: supine kub · 0.17mm/px · 2 of 2 slices shown]
[im 1/2]
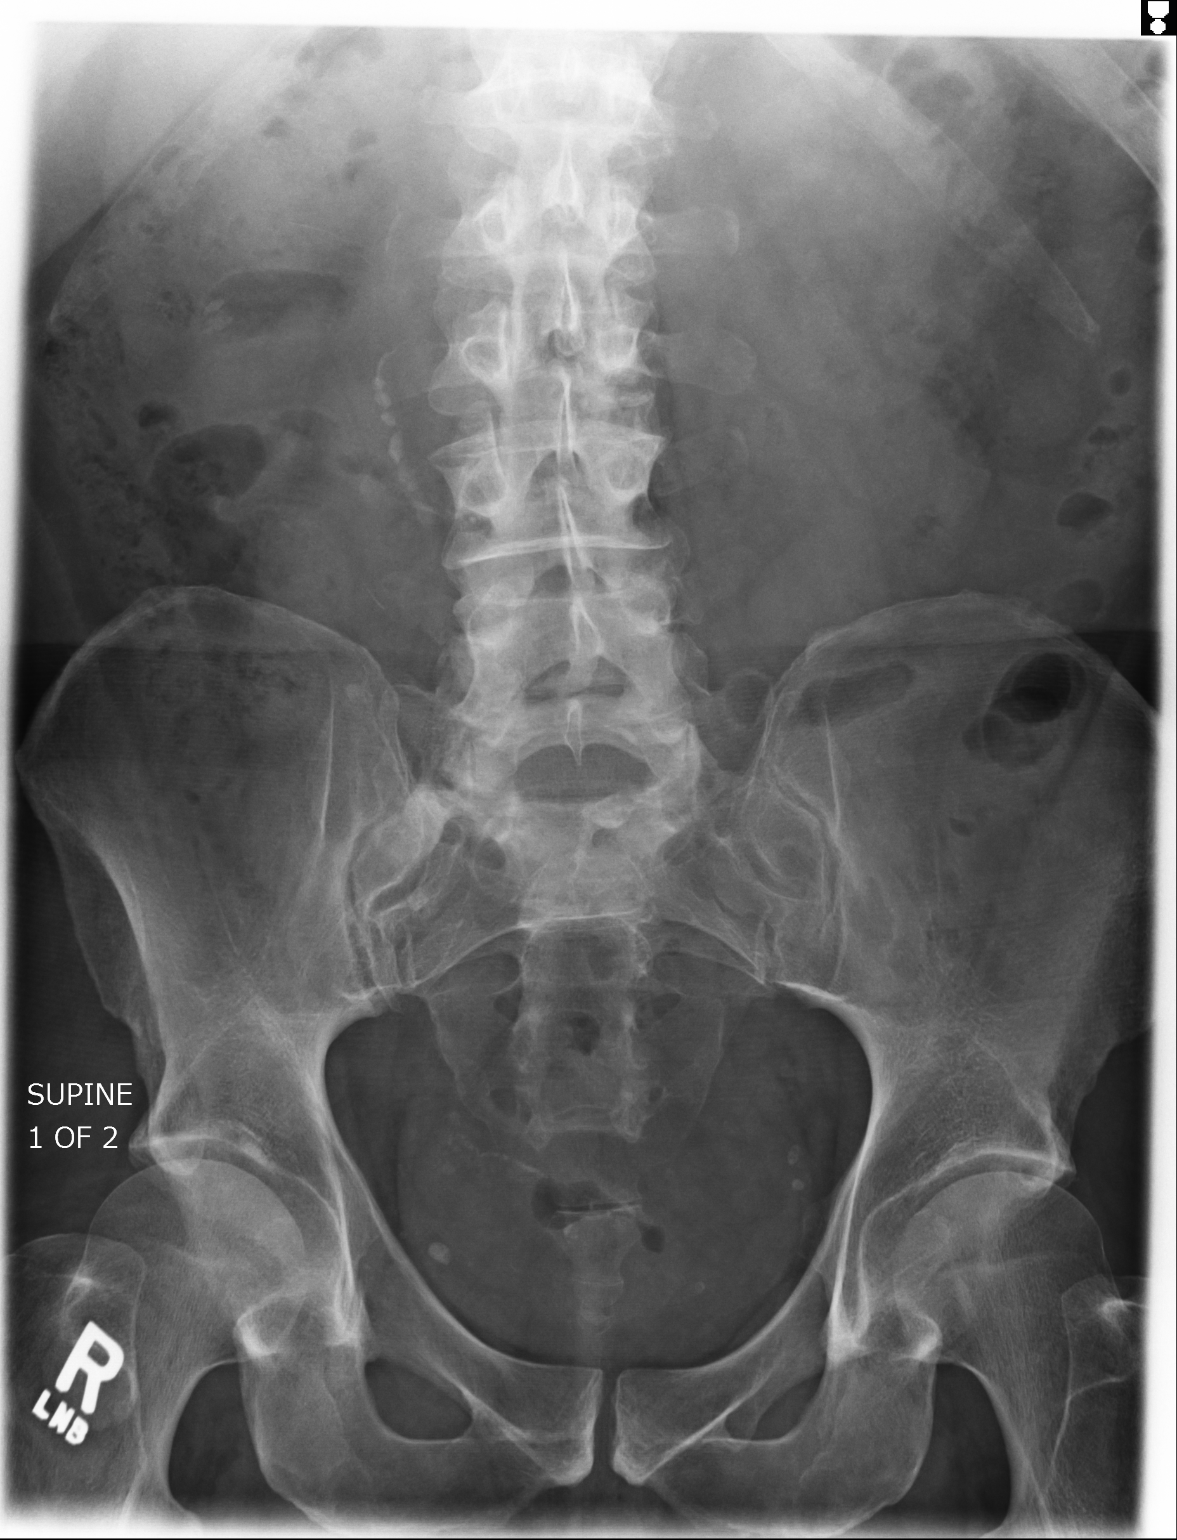
[im 2/2]
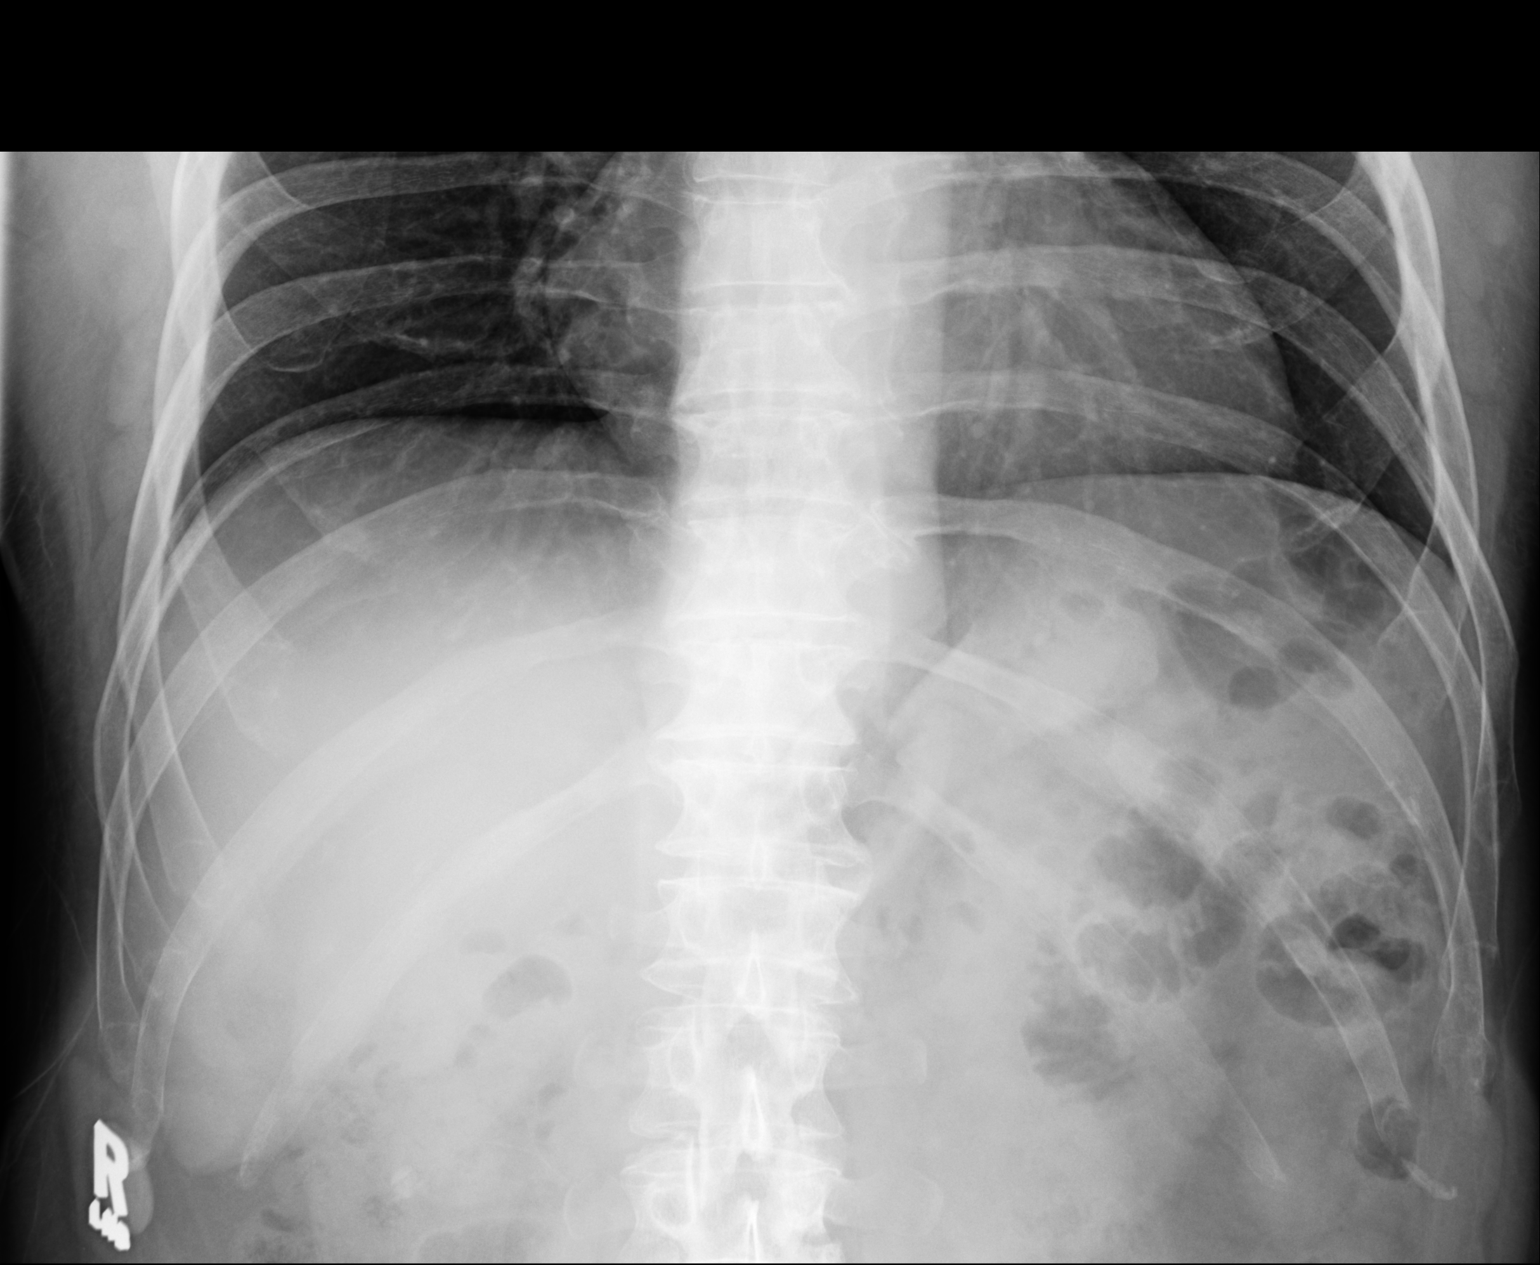

[2 of 2 positions shown; findings below may reference images not displayed]

PROCEDURE:     DXR - DXR KIDNEY URETER BLADDER  - March 01, 2013 [DATE]

RESULT:     Comparison is made to the study 21 December, 2012.

On the right there are multiple stacked calcifications measuring 5 mm in
diameter or less which may lie in the proximal ureter. A mid to lower pole
stone on the right measuring 0.8 cm in diameter is suspected. On the left
definite stones are not appreciated. There are phleboliths within the
pelvis. The bowel gas pattern is within the limits of normal.
IMPRESSION: There are calcifications which are likely stacked in the
proximal right ureter suggesting Steinstrasse. There is also a stone in the
mid to lower pole of the right kidney.

[REDACTED]

## 2014-03-25 ENCOUNTER — Telehealth: Payer: Self-pay

## 2014-03-25 NOTE — Telephone Encounter (Signed)
Pt request refill allopurinol; [facility] said too early to refill; pt has 22 pills left; pt will wait until closer to needing refill and will cb.

## 2014-04-07 ENCOUNTER — Other Ambulatory Visit: Payer: Self-pay | Admitting: Family Medicine

## 2014-05-02 ENCOUNTER — Ambulatory Visit (INDEPENDENT_AMBULATORY_CARE_PROVIDER_SITE_OTHER): Payer: BC Managed Care – PPO | Admitting: Family Medicine

## 2014-05-02 ENCOUNTER — Encounter: Payer: Self-pay | Admitting: Family Medicine

## 2014-05-02 ENCOUNTER — Encounter (INDEPENDENT_AMBULATORY_CARE_PROVIDER_SITE_OTHER): Payer: Self-pay

## 2014-05-02 VITALS — BP 114/62 | HR 64 | Temp 98.5°F | Ht 68.0 in | Wt 209.2 lb

## 2014-05-02 DIAGNOSIS — E785 Hyperlipidemia, unspecified: Secondary | ICD-10-CM

## 2014-05-02 DIAGNOSIS — M109 Gout, unspecified: Secondary | ICD-10-CM

## 2014-05-02 DIAGNOSIS — K449 Diaphragmatic hernia without obstruction or gangrene: Secondary | ICD-10-CM

## 2014-05-02 DIAGNOSIS — Z0000 Encounter for general adult medical examination without abnormal findings: Secondary | ICD-10-CM

## 2014-05-02 DIAGNOSIS — Z125 Encounter for screening for malignant neoplasm of prostate: Secondary | ICD-10-CM

## 2014-05-02 NOTE — Assessment & Plan Note (Signed)
Stable watching diet and on omeprazole 20mg  daily.

## 2014-05-02 NOTE — Assessment & Plan Note (Signed)
Chronic, stable. On allopurinol.

## 2014-05-02 NOTE — Assessment & Plan Note (Signed)
Recheck FLP today. 

## 2014-05-02 NOTE — Progress Notes (Signed)
Pre visit review using our clinic review tool, if applicable. No additional management support is needed unless otherwise documented below in the visit note. 

## 2014-05-02 NOTE — Patient Instructions (Signed)
Return at your convenience fasting for labwork - tomorrow morning. Good to see you today, call us with questions. Return as needed or in 1 year for next physical.

## 2014-05-02 NOTE — Progress Notes (Signed)
BP 114/62  Pulse 64  Temp(Src) 98.5 F (36.9 C) (Oral)  Ht 5\' 8"  (1.727 m)  Wt 209 lb 4 oz (94.915 kg)  BMI 31.82 kg/m2  SpO2 97%   CC: CPE  Subjective:    Patient ID: Timothy Landry, male    DOB: [birth year], 53 y.o.   MRN: [id number]  HPI: Timothy Landry is a 54 y.o. male presenting on 05/02/2014 for Annual Exam   H/o nephrolithiasis s/p stent placement. Has been on flomax recently, has not seen Dr Eliberto Ivory. No recent trouble with this.  Preventative: Colon screening - colonoscopy benign polyps, rpt 10 yrs (Pyrtle). 04/2012 Prostate screening - some changes to stream. Will screen today.  Flu shot - at work  Tetanus 2009.  Sunscreen use dicussed. 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  Seat belt 100%.   Caffeine: 1 mountain dew/day  Lives with wife, mother in law, 1 puppy  Occupation: works at [employer]: work, and out in yard. no regular exercise  Diet: good water, vegetables daily  Relevant past medical, surgical, family and social history reviewed and updated as indicated.  Allergies and medications reviewed and updated. Current Outpatient Prescriptions on File Prior to Visit  Medication Sig  . allopurinol (ZYLOPRIM) 300 MG tablet TAKE 1 TABLET BY MOUTH EVERY DAY  . omeprazole (PRILOSEC OTC) 20 MG tablet Take 20 mg by mouth daily.     No current facility-administered medications on file prior to visit.    Review of Systems  Constitutional: Negative for fever, chills, activity change, appetite change, fatigue and unexpected weight change.  HENT: Negative for hearing loss.   Eyes: Negative for visual disturbance.  Respiratory: Negative for cough, chest tightness, shortness of breath and wheezing.   Cardiovascular: Negative for chest pain, palpitations and leg swelling.  Gastrointestinal: Negative for nausea, vomiting, abdominal pain, diarrhea, constipation, blood in stool and abdominal distention.  Genitourinary: Negative for hematuria and difficulty urinating.    Musculoskeletal: Negative for arthralgias, myalgias and neck pain.  Skin: Negative for rash.  Neurological: Negative for dizziness, seizures, syncope and headaches.  Hematological: Negative for adenopathy. Does not bruise/bleed easily.  Psychiatric/Behavioral: Negative for dysphoric mood. The patient is not nervous/anxious.    Per HPI unless specifically indicated above    Objective:    BP 114/62  Pulse 64  Temp(Src) 98.5 F (36.9 C) (Oral)  Ht 5\' 8"  (1.727 m)  Wt 209 lb 4 oz (94.915 kg)  BMI 31.82 kg/m2  SpO2 97%  Physical Exam  Nursing note and vitals reviewed. Constitutional: He is oriented to person, place, and time. He appears well-developed and well-nourished. No distress.  HENT:  Head: Normocephalic and atraumatic.  Right Ear: Hearing, tympanic membrane, external ear and ear canal normal.  Left Ear: Hearing, tympanic membrane, external ear and ear canal normal.  Nose: Nose normal.  Mouth/Throat: Uvula is midline, oropharynx is clear and moist and mucous membranes are normal. No oropharyngeal exudate, posterior oropharyngeal edema or posterior oropharyngeal erythema.  Eyes: Conjunctivae and EOM are normal. Pupils are equal, round, and reactive to light. No scleral icterus.  Neck: Normal range of motion. Neck supple. No thyromegaly present.  Cardiovascular: Normal rate, regular rhythm, normal heart sounds and intact distal pulses.   No murmur heard. Pulses:      Radial pulses are 2+ on the right side, and 2+ on the left side.  Pulmonary/Chest: Effort normal and breath sounds normal. No respiratory distress. He has no wheezes. He has no rales.  Abdominal: Soft. Bowel sounds are normal. He exhibits no distension and no mass. There is no tenderness. There is no rebound and no guarding.  Genitourinary: Rectum normal and prostate normal. Rectal exam shows no external hemorrhoid, no internal hemorrhoid, no fissure, no mass, no tenderness and anal tone normal. Prostate is not  enlarged (15-20gm) and not tender.  Musculoskeletal: Normal range of motion. He exhibits no edema.  Lymphadenopathy:    He has no cervical adenopathy.  Neurological: He is alert and oriented to person, place, and time.  CN grossly intact, station and gait intact  Skin: Skin is warm and dry. No rash noted.  Psychiatric: He has a normal mood and affect. His behavior is normal. Judgment and thought content normal.       Assessment & Plan:   Problem List Items Addressed This Visit   HYPERLIPIDEMIA     Recheck FLP today.    Relevant Orders      Lipid panel      Comprehensive metabolic panel   HIATAL HERNIA WITH REFLUX     Stable watching diet and on omeprazole 20mg  daily.    Healthcare maintenance - Primary     Preventative protocols reviewed and updated unless pt declined. Discussed healthy diet and lifestyle. Flu shot to receive at work    GOUT     Chronic, stable. On allopurinol.    Relevant Orders      Uric acid    Other Visit Diagnoses   Special screening for malignant neoplasm of prostate        Relevant Orders       PSA        Follow up plan: Return in about 1 year (around 05/03/2015), or as needed, for annual exam, prior fasting for blood work.

## 2014-05-02 NOTE — Assessment & Plan Note (Signed)
Preventative protocols reviewed and updated unless pt declined. Discussed healthy diet and lifestyle. Flu shot to receive at work

## 2014-05-03 ENCOUNTER — Other Ambulatory Visit (INDEPENDENT_AMBULATORY_CARE_PROVIDER_SITE_OTHER): Payer: BC Managed Care – PPO

## 2014-05-03 DIAGNOSIS — M109 Gout, unspecified: Secondary | ICD-10-CM

## 2014-05-03 DIAGNOSIS — Z125 Encounter for screening for malignant neoplasm of prostate: Secondary | ICD-10-CM

## 2014-05-03 DIAGNOSIS — E785 Hyperlipidemia, unspecified: Secondary | ICD-10-CM

## 2014-05-03 LAB — LIPID PANEL
CHOL/HDL RATIO: 4
CHOLESTEROL: 185 mg/dL (ref 0–200)
HDL: 42.1 mg/dL (ref >39.00)
LDL Cholesterol: 126 mg/dL — ABNORMAL HIGH (ref 0–99)
NonHDL: 142.9
Triglycerides: 85 mg/dL (ref 0.0–149.0)
VLDL: 17 mg/dL (ref 0.0–40.0)

## 2014-05-03 LAB — COMPREHENSIVE METABOLIC PANEL
ALBUMIN: 3.5 g/dL (ref 3.5–5.2)
ALK PHOS: 64 U/L (ref 39–117)
ALT: 20 U/L (ref 0–53)
AST: 23 U/L (ref 0–37)
BUN: 22 mg/dL (ref 6–23)
CHLORIDE: 107 meq/L (ref 96–112)
CO2: 27 mEq/L (ref 19–32)
Calcium: 8.5 mg/dL (ref 8.4–10.5)
Creatinine, Ser: 1 mg/dL (ref 0.4–1.5)
GFR: 83.6 mL/min (ref >60.00)
GLUCOSE: 100 mg/dL — AB (ref 70–99)
POTASSIUM: 4.1 meq/L (ref 3.5–5.1)
Sodium: 144 mEq/L (ref 135–145)
TOTAL PROTEIN: 6.1 g/dL (ref 6.0–8.3)
Total Bilirubin: 0.5 mg/dL (ref 0.2–1.2)

## 2014-05-03 LAB — PSA: PSA: 0.46 ng/mL (ref 0.10–4.00)

## 2014-05-03 LAB — URIC ACID: Uric Acid, Serum: 4.3 mg/dL (ref 4.0–7.8)

## 2014-05-06 ENCOUNTER — Encounter: Payer: Self-pay | Admitting: Family Medicine

## 2014-08-16 DIAGNOSIS — N400 Enlarged prostate without lower urinary tract symptoms: Secondary | ICD-10-CM

## 2014-08-16 HISTORY — DX: Benign prostatic hyperplasia without lower urinary tract symptoms: N40.0

## 2014-09-29 ENCOUNTER — Other Ambulatory Visit: Payer: Self-pay | Admitting: Family Medicine

## 2014-12-06 NOTE — H&P (Signed)
PATIENT NAME:  Timothy Landry, Timothy Landry MR#:  [id number] DATE OF BIRTH:  1961-05-01  DATE OF ADMISSION:  03/12/2013  CHIEF COMPLAINT: Kidney stones.   HISTORY OF PRESENT ILLNESS: Timothy Landry is a 54 year old white male who presented to the office with an 8 mm right renal stone and 14 mm right UPJ stone. He underwent lithotripsy initially May 8th and then had repeat lithotripsy July 17th because of obstructing fragment with proximal Steinstrasse. He has failed to pass the obstructing fragments and comes in now for ureteroscopy with stent placement.   ALLERGIES: No known drug allergies.   CURRENT MEDICATIONS: Percocet, omeprazole and allopurinol.   PAST SURGICAL HISTORY: Included stent placement with lithotripsy in 2003, vasectomy in 2000 and removal of foot and hand ganglion cyst in 1999.   SOCIAL HISTORY: He smokes a pack a day and has a 20 pack-year history. He consumes 1 to 4 alcoholic beverages per week.   FAMILY HISTORY: Remarkable for parents with heart disease, diabetes, hypertension and chronic renal disease.   PAST AND CURRENT MEDICAL CONDITIONS: 1.  Gout. 2.  GERD. 3.  Recurrent kidney stone disease.   REVIEW OF SYSTEMS: The patient denied chest pain, shortness of breath, diabetes or stroke.   PHYSICAL EXAMINATION: GENERAL: A well-nourished white male in no distress. HEENT: Sclerae were clear. Pupils were equal, round and reactive to light and accommodation. Extraocular movements were intact.  NECK: Supple. No palpable cervical adenopathy. No audible carotid bruits.  LUNGS: Clear to auscultation.  HEART: Regular rhythm and rate without audible murmurs.  ABDOMEN: Soft, nontender abdomen.  GENITOURINARY AND RECTAL: Deferred.  NEUROMUSCULAR: Grossly intact. NEUROLOGIC:  He is alert and oriented x 3.   IMPRESSION: Obstructing right ureteral stone fragments.   PLAN: Ureteroscopy with stent placement and possible laser lithotripsy. ____________________________ Otelia Limes. Yves Dill,  MD [id number] D: 03/09/2013 11:39:49 ET T: 03/09/2013 11:50:40 ET JOB#: [id number]  cc: Otelia Limes. Yves Dill, MD, <Dictator> Royston Cowper MD ELECTRONICALLY SIGNED 03/11/2013 9:57

## 2014-12-06 NOTE — Op Note (Signed)
PATIENT NAME:  Timothy Landry, Timothy Landry MR#:  [id number] DATE OF BIRTH:  [birth year]  DATE OF PROCEDURE:  03/12/2013  PREOPERATIVE DIAGNOSIS: Right ureterolithiasis.   POSTOPERATIVE DIAGNOSIS: Right ureterolithiasis.  PROCEDURES:  1.  Right ureteroscopic ureterolithotomy with holmium laser lithotripsy.  2.  Right double pigtail stent placement.  3.  Right retrograde pyelogram.  4.  Fluoroscopy.   SURGEON: Maryan Puls, M.D.   ANESTHETIST: Dr. Ronelle Nigh and Dr. Yves Dill.   ANESTHESIA: General per Dr. Ronelle Nigh and local per Dr. Yves Dill.   INDICATIONS: See the dictated physical. After informed consent, the patient requests the above procedure.   OPERATIVE SUMMARY: After adequate general anesthesia had been obtained, the patient was placed into dorsal lithotomy position and the perineum was prepped and draped in the usual fashion. The 21-French cystoscope was coupled with the camera and then visually advanced into the bladder. The bladder was thoroughly inspected. No bladder mucosal lesions were identified. At this point, an 8-French cone-tipped ureteral catheter was advanced into the right orifice and retrograde pyelogram was performed. The patient was noted to have a Steinstrasse located at the level of the right iliac vessels. He had mild proximal hydronephrosis as well. At this point, the cystoscope was removed. The mini rigid ureteroscope was then advanced visually into the right ureter. A lead fragment measuring approximately 4 to 5 mm in size was encountered and was fragmented with a 300 micron holmium laser. At this point, the ureteroscope was removed. The cystoscope was placed back into the bladder. A 0.035 Glidewire was then advanced up the right ureter under fluoroscopic guidance. A 6 x 26 cm double pigtail stent was then placed into the right ureter under fluoroscopic guidance. The guidewire was removed taking care to leave the stent in position. The bladder was drained. The scope was removed. A 10 mL of  viscous Xylocaine was instilled within the urethra. B and O suppository was placed. The procedure was then terminated and the patient was transferred to the recovery room in stable condition.  ____________________________ Otelia Limes. Yves Dill, MD [email or web address] D: 03/12/2013 08:20:04 ET T: 03/12/2013 08:31:04 ET JOB#: [id number]  cc: Otelia Limes. Yves Dill, MD, <Dictator> Royston Cowper MD ELECTRONICALLY SIGNED 03/12/2013 11:19

## 2014-12-20 ENCOUNTER — Ambulatory Visit (INDEPENDENT_AMBULATORY_CARE_PROVIDER_SITE_OTHER): Payer: BLUE CROSS/BLUE SHIELD | Admitting: Family Medicine

## 2014-12-20 ENCOUNTER — Encounter: Payer: Self-pay | Admitting: Family Medicine

## 2014-12-20 VITALS — BP 142/100 | HR 72 | Temp 98.0°F | Wt 208.5 lb

## 2014-12-20 DIAGNOSIS — R079 Chest pain, unspecified: Secondary | ICD-10-CM | POA: Diagnosis not present

## 2014-12-20 DIAGNOSIS — R55 Syncope and collapse: Secondary | ICD-10-CM | POA: Diagnosis not present
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
## 2014-12-20 NOTE — Patient Instructions (Signed)
Thyroid checked today. Pass by Marion's office for referral to heart doctor. If any recurrent episodes let us know right away.

## 2014-12-20 NOTE — Assessment & Plan Note (Signed)
Brief ~56min episode of endorsed syncope (does not sound like seizure or falling asleep). Reported normal head CT at ER, will await records. ?anxiety related, but would be strange presentation. Given associated with chest discomfort, will refer to cards for further evaluation. Pt agrees with plan.

## 2014-12-20 NOTE — Progress Notes (Signed)
Pre visit review using our clinic review tool, if applicable. No additional management support is needed unless otherwise documented below in the visit note. 

## 2014-12-21 LAB — TSH: TSH: 1.496 u[IU]/mL (ref 0.350–4.500)

## 2014-12-23 ENCOUNTER — Encounter: Payer: Self-pay | Admitting: *Deleted

## 2015-01-22 ENCOUNTER — Encounter: Payer: Self-pay | Admitting: Cardiovascular Disease

## 2015-01-22 ENCOUNTER — Ambulatory Visit (INDEPENDENT_AMBULATORY_CARE_PROVIDER_SITE_OTHER): Payer: BLUE CROSS/BLUE SHIELD | Admitting: Cardiovascular Disease

## 2015-01-22 VITALS — BP 131/87 | HR 51 | Ht 69.0 in | Wt 210.2 lb

## 2015-01-22 DIAGNOSIS — R079 Chest pain, unspecified: Secondary | ICD-10-CM

## 2015-01-22 DIAGNOSIS — R55 Syncope and collapse: Secondary | ICD-10-CM | POA: Diagnosis not present

## 2015-01-22 NOTE — Patient Instructions (Signed)
Medication Instructions:  Your physician recommends that you continue on your current medications as directed. Please refer to the Current Medication list given to you today.   Labwork: none  Testing/Procedures: Your physician has recommended that you wear a holter monitor. Holter monitors are medical devices that record the heart's electrical activity. Doctors most often use these monitors to diagnose arrhythmias. Arrhythmias are problems with the speed or rhythm of the heartbeat. The monitor is a small, portable device. You can wear one while you do your normal daily activities. This is usually used to diagnose what is causing palpitations/syncope (passing out).  Your physician has requested that you have a stress echocardiogram. .    Follow-Up: Your physician recommends that you schedule a follow-up appointment with Dr. Fletcher Anon as needed.     Any Other Special Instructions Will Be Listed Below (If Applicable).  Exercise Stress Echocardiogram An exercise stress echocardiogram is a heart (cardiac) test used to check the function of your heart. This test may also be called an exercise stress echocardiography or stress echo. This stress test will check how well your heart muscle and valves are working and determine if your heart muscle is getting enough blood. You will exercise on a treadmill to naturally increase or stress the functioning of your heart.  An echocardiogram uses sound waves (ultrasound) to produce an image of your heart. If your heart does not work normally, it may indicate coronary artery disease with poor coronary blood supply. The coronary arteries are the arteries that bring blood and oxygen to your heart. LET [facility] CARE PROVIDER KNOW ABOUT:  Any allergies you have.  All medicines you are taking, including vitamins, herbs, eye drops, creams, and over-the-counter medicines.  Previous problems you or members of your family have had with the use of anesthetics.  Any  blood disorders you have.  Previous surgeries you have had.  Medical conditions you have.  Possibility of pregnancy, if this applies. RISKS AND COMPLICATIONS Generally, this is a safe procedure. However, as with any procedure, complications can occur. Possible complications can include:  You develop pain or pressure in the following areas:  Chest.  Jaw or neck.  Between your shoulder blades.  Radiating down your left arm.  Dizziness or lightheadedness.  Shortness of breath.  Increased or irregular heartbeat.  Nausea or vomiting.  Heart attack (rare). BEFORE THE PROCEDURE  Avoid all forms of caffeine for 24 hours before your test or as directed by your health care provider. This includes coffee, tea (even decaffeinated tea), caffeinated sodas, chocolate, cocoa, and certain pain medicines.  Follow your health care provider's instructions regarding eating and drinking before the test.  Take your medicines as directed at regular times with water unless instructed otherwise. Exceptions may include:  If you have diabetes, ask how you are to take your insulin or pills. It is common to adjust insulin dosing the morning of the test.  If you are taking beta-blocker medicines, it is important to talk to your health care provider about these medicines well before the date of your test. Taking beta-blocker medicines may interfere with the test. In some cases, these medicines need to be changed or stopped 24 hours or more before the test.  If you wear a nitroglycerin patch, it may need to be removed prior to the test. Ask your health care provider if the patch should be removed before the test.  If you use an inhaler for any breathing condition, bring it with you to the  test.  If you are an outpatient, bring a snack so you can eat right after the stress phase of the test.  Do not smoke for 4 hours prior to the test or as directed by your health care provider.  Wear loose-fitting  clothes and comfortable shoes for the test. This test involves walking on a treadmill. PROCEDURE   Multiple electrodes will be put on your chest. If needed, small areas of your chest may be shaved to get better contact with the electrodes. Once the electrodes are attached to your body, multiple wires will be attached to the electrodes, and your heart rate will be monitored.  You will have an echocardiogram done at rest.  To produce this image of your heart, gel is applied to your chest, and a wand-like tool (transducer) is moved over the chest. The transducer sends the sound waves through the chest to create the moving images of your heart.  You may need an IV to receive a medication that improves the quality of the pictures.  You will then walk on a treadmill. The treadmill will be started at a slow pace. The treadmill speed and incline will gradually be increased to raise your heart rate.  At the peak of exercise, the treadmill will be stopped. You will lie down immediately on a bed so that a second echocardiogram can be done to visualize your heart's motion with exercise.  The test usually takes 30-60 minutes to complete. AFTER THE PROCEDURE  Your heart rate and blood pressure will be monitored after the test.  You may return to your normal schedule, including diet, activities, and medicines, unless your health care provider tells you otherwise. Document Released: 08/06/2004 Document Revised: 08/07/2013 Document Reviewed: 04/09/2013 [facility] Patient Information 2015 [place], [place]. This information is not intended to replace advice given to you by your health care provider. Make sure you discuss any questions you have with your health care provider.

## 2015-01-22 NOTE — Assessment & Plan Note (Signed)
I requested a stress echocardiogram for evaluation. He does have nonspecific ST depression on his EKG with mildly prolonged QRS. Thus, a treadmill stress test alone is not sufficient.
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
## 2015-01-22 NOTE — Assessment & Plan Note (Signed)
The exact etiology is not entirely clear. Vasovagal etiology is a possibility although the presentation is atypical. He does have baseline bradycardia with mildly prolonged QRS. I requested a 48-hour Holter monitor. Given that proceeding symptoms of chest pain, we should also exclude ischemic heart disease.

## 2015-02-19 IMAGING — CT CT STONE STUDY
1 of 2 series · 15 of 32 positions shown, 19 images · non-contrast
Comparison: none

REASON FOR EXAM: right flank pain
COMMENTS:

[Series 2: 3mm soft tissue · axial · 0.72mm/px · z∈[-962,-512]mm · 15 of 164 slices shown, 19 images]
[im 7/164  soft-tissue]
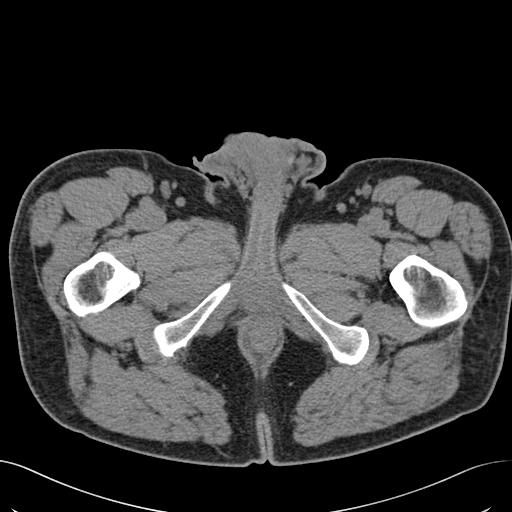
[im 7/164  bone]
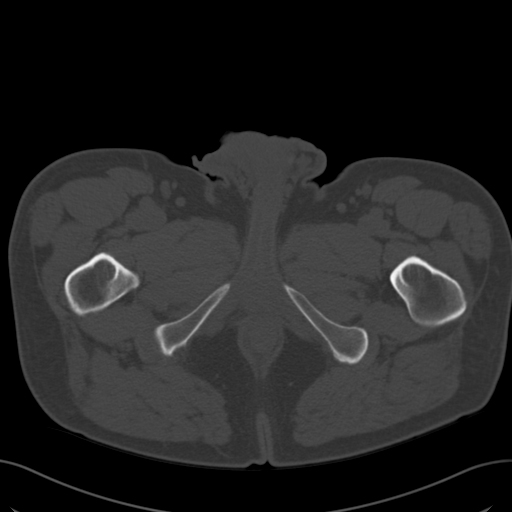
[im 21/164  soft-tissue]
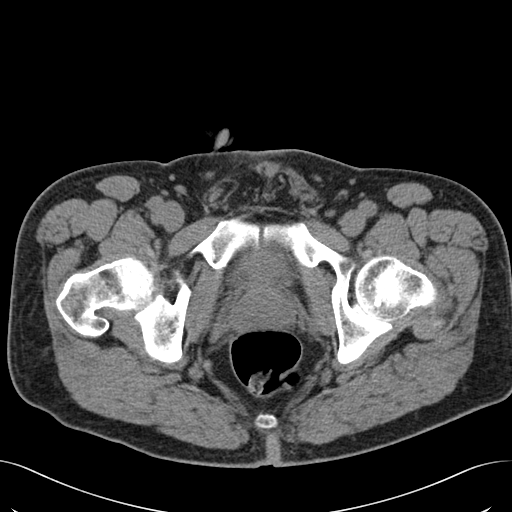
[im 34/164  soft-tissue]
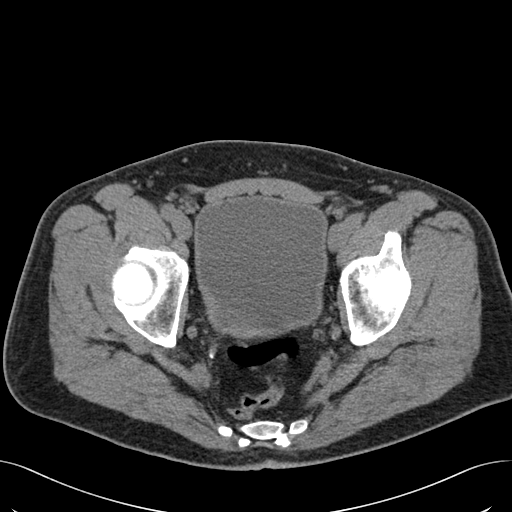
[im 48/164  soft-tissue]
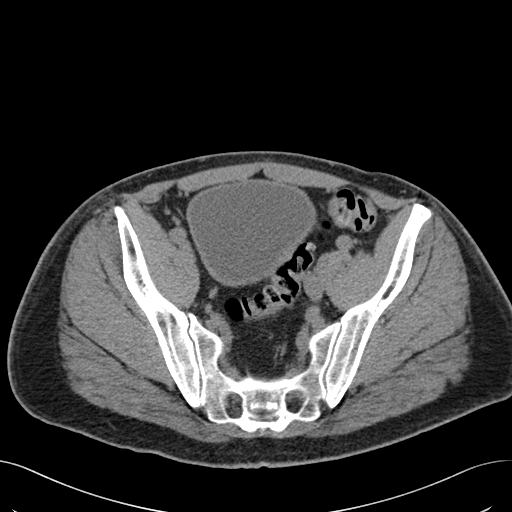
[im 55/164  soft-tissue]
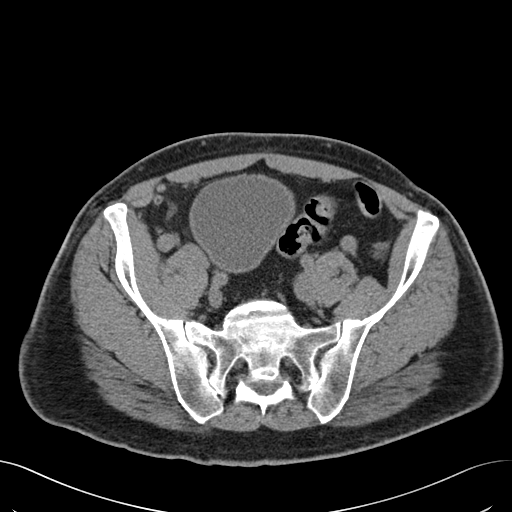
[im 68/164  soft-tissue]
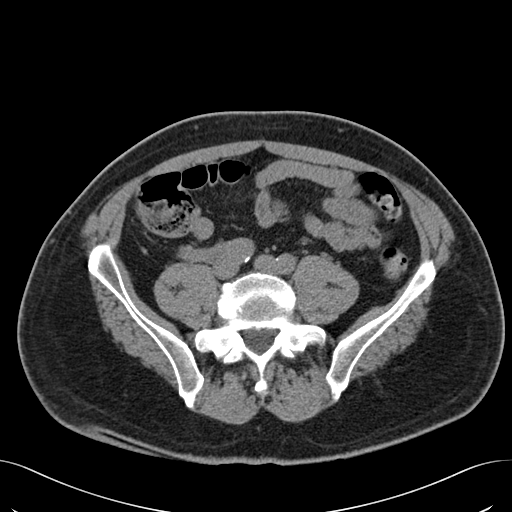
[im 82/164  soft-tissue]
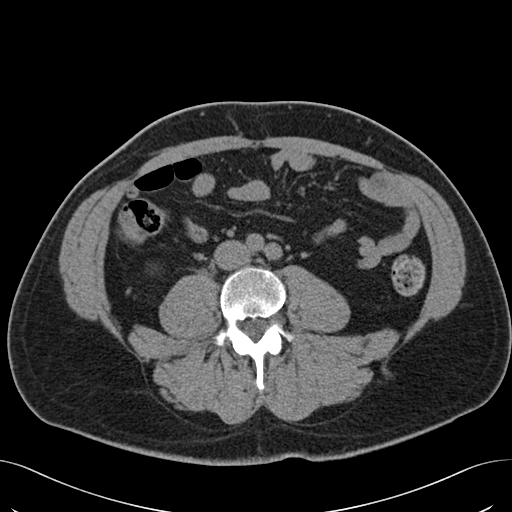
[im 96/164  soft-tissue]
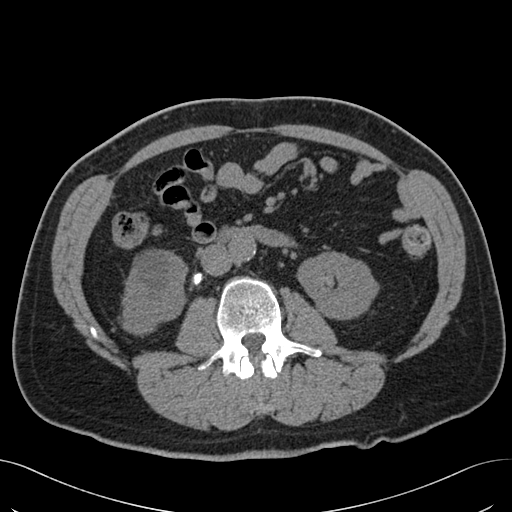
[im 109/164  soft-tissue]
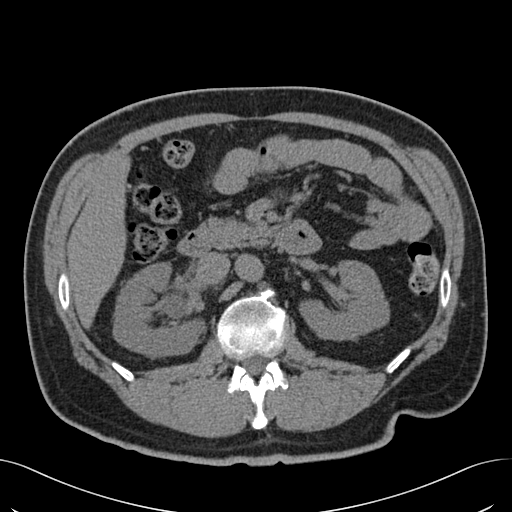
[im 109/164  bone]
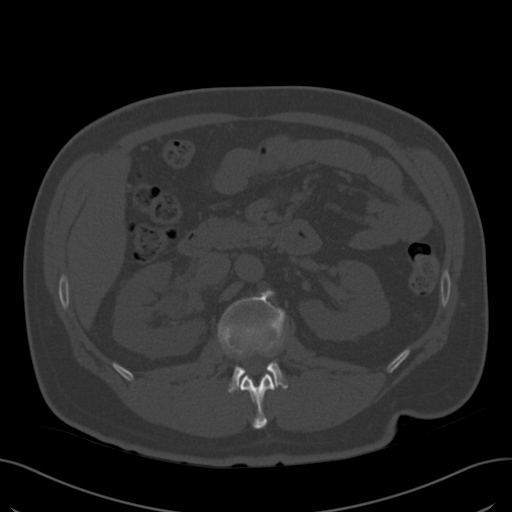
[im 116/164  soft-tissue]
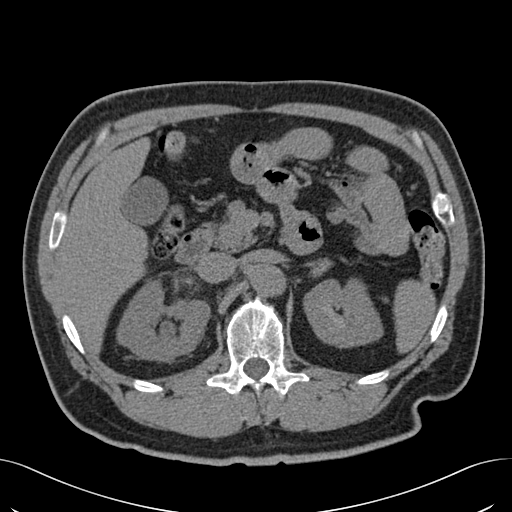
[im 130/164  soft-tissue]
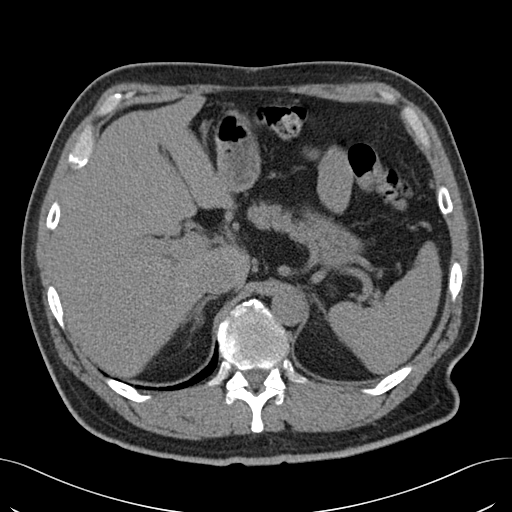
[im 136/164  lung]
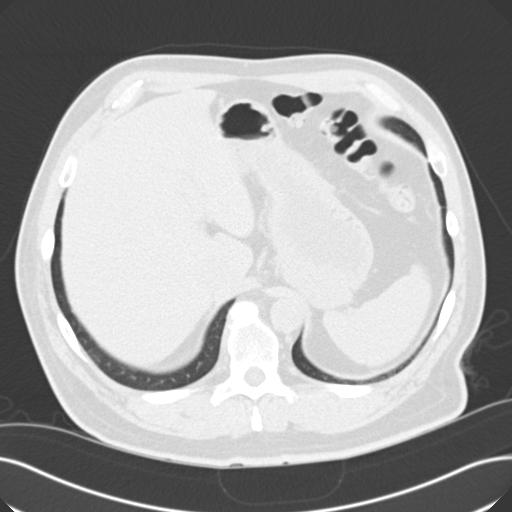
[im 143/164  soft-tissue]
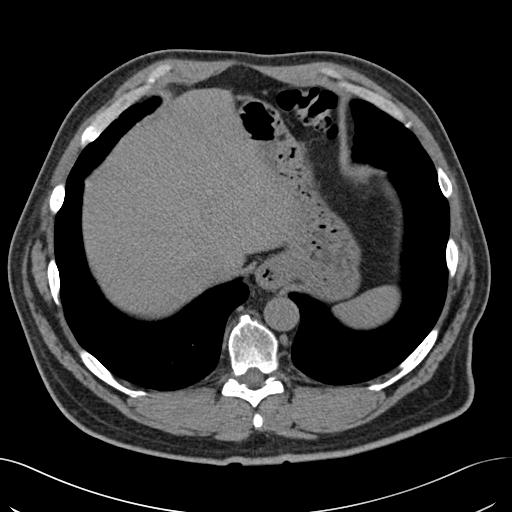
[im 143/164  lung]
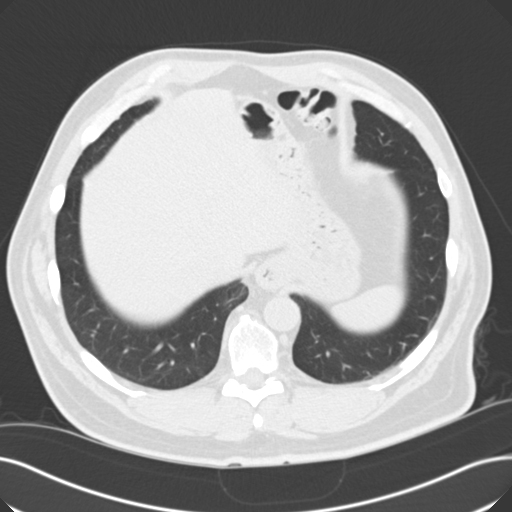
[im 150/164  lung]
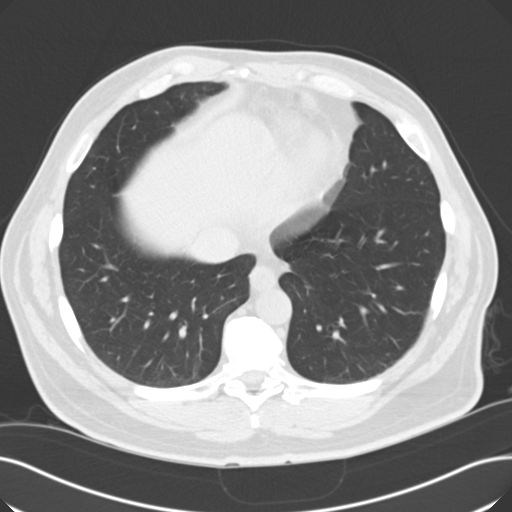
[im 157/164  soft-tissue]
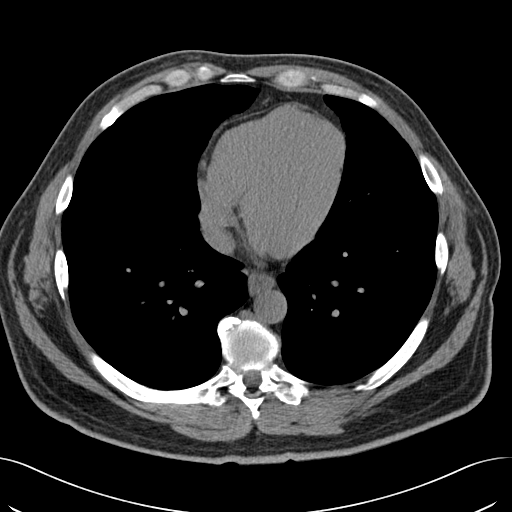
[im 157/164  lung]
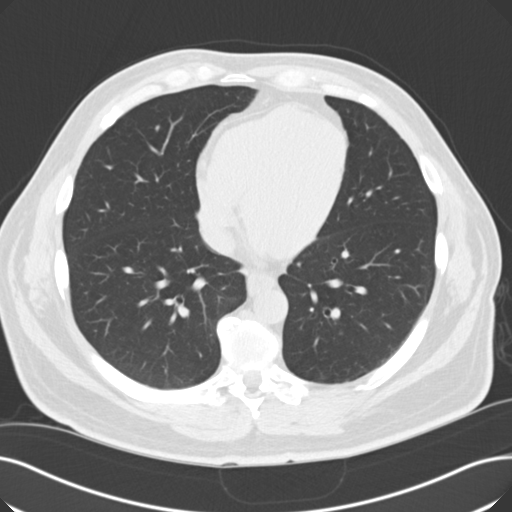

[15 of 32 positions shown; findings below may reference images not displayed]

PROCEDURE:     CT  - CT ABDOMEN /PELVIS WO (STONE)  - December 13, 2012  [DATE]

RESULT:     Axial noncontrast CT scanning was performed through the abdomen
and pelvis with reconstructions at 3 mm intervals and slice thicknesses.
Review of multiplanar reconstructed images was performed separately on the
VIA monitor.

There is moderate hydronephrosis on the right secondary to a large stone at
the ureteropelvic junction. The stone measures 11 mm in greatest dimension.
There are no other stones demonstrated on the right. There is a dominant
hypodensity exophytic from the lower pole of the right kidney measuring
cm in diameter with Hounsfield measurement a positive to most compatible
with a cyst. The left kidney exhibits no evidence of stones nor of
obstruction. Faint medullary calcification is demonstrated on the left on
image 67.

The urinary bladder is partially distended. The prostate gland produces a
moderate impression upon the urinary bladder base. The liver, gallbladder,
spleen, partially distended stomach, pancreas, adrenal glands, and
periaortic and pericaval regions are normal in appearance. The unopacified
loops of small and large bowel exhibit no acute abnormality. There is no
inguinal nor umbilical hernia. There is no periaortic or pericaval
lymphadenopathy. The psoas musculature is normal in appearance.

The lung bases are clear. The lumbar vertebral bodies are preserved in
height.
IMPRESSION: 1. There is moderate hydronephrosis and hydroureter on the right secondary
to an 11 mm diameter stone at the right ureteropelvic junction.
2. There is no evidence of obstruction of the left kidney.
3. There is no acute hepatobiliary abnormality nor acute bowel abnormality.

[REDACTED]

## 2015-02-28 ENCOUNTER — Other Ambulatory Visit: Payer: BLUE CROSS/BLUE SHIELD

## 2015-03-24 ENCOUNTER — Other Ambulatory Visit: Payer: Self-pay | Admitting: Family Medicine

## 2015-05-16 ENCOUNTER — Other Ambulatory Visit: Payer: BLUE CROSS/BLUE SHIELD

## 2015-05-16 ENCOUNTER — Encounter: Payer: Self-pay | Admitting: *Deleted

## 2015-05-16 NOTE — Patient Instructions (Addendum)
  Your procedure is scheduled on: 05-20-15 Report to [facility] [place] To find out your arrival time please call [phone] between 1PM - 3PM on 05-19-15  Remember: Instructions that are not followed completely may result in serious medical risk, up to and including death, or upon the discretion of your surgeon and anesthesiologist your surgery may need to be rescheduled.    _X___ 1. Do not eat food or drink liquids after midnight. No gum chewing or hard candies.     _X___ 2. No Alcohol for 24 hours before or after surgery.   ____ 3. Bring all medications with you on the day of surgery if instructed.    ____ 4. Notify your doctor if there is any change in your medical condition     (cold, fever, infections).     Do not wear jewelry, make-up, hairpins, clips or nail polish.  Do not wear lotions, powders, or perfumes. You may wear deodorant.  Do not shave 48 hours prior to surgery. Men may shave face and neck.  Do not bring valuables to the hospital.    [facility] is not responsible for any belongings or valuables.               Contacts, dentures or bridgework may not be worn into surgery.  Leave your suitcase in the car. After surgery it may be brought to your room.  For patients admitted to the hospital, discharge time is determined by your treatment team.   Patients discharged the day of surgery will not be allowed to drive home.   Please read over the following fact sheets that you were given:      _X___ Take these medicines the morning of surgery with A SIP OF WATER:    1. PRILOSEC  2. TAKE AN EXTRA PRILOSEC Monday NIGHT  3.   4.  5.  6.  ____ Fleet Enema (as directed)   ____ Use CHG Soap as directed  ____ Use inhalers on the day of surgery  ____ Stop metformin 2 days prior to surgery    ____ Take 1/2 of usual insulin dose the night before surgery and none on the morning of surgery.   ____ Stop Coumadin/Plavix/aspirin-N/A  ____ Stop  Anti-inflammatories-NO NSAIDS OR ASA PRODUCTS-TYLENOL OK   ____ Stop supplements until after surgery.    ____ Bring C-Pap to the hospital.

## 2015-05-17 HISTORY — PX: OTHER SURGICAL HISTORY: SHX169

## 2015-05-19 ENCOUNTER — Telehealth: Payer: Self-pay

## 2015-05-19 ENCOUNTER — Encounter: Payer: Self-pay | Admitting: *Deleted

## 2015-05-19 NOTE — Pre-Procedure Instructions (Signed)
SPOKE WITH DR Goodman PHONE IN REGARDS TO PT HAVING SYNCOPAL EPISODE BACK IN June 2016 WHO FOLLOWED UP WITH DR ARIDA WHO WANTED PT TO DO A STRESS TEST AND WEAR A HOLTER MONITOR BUT PT NEVER FOLLOWED UP TO DO SO.  DR Donaldson PT NEEDS TO HAVE THIS DONE PRIOR TO HIS PROSTATE SURGERY-NOTIFIED ANGIE AT DR WOLFFS OFFICE THAT PT IS GOING TO HAVE TO BE CANCELLED UNTIL STRESS CAN BE DONE. ANGIE TO CALL PT AND MAKE HIM AWARE OF THIS. I CALLED DR ARIDA'S OFFICE AND SPOKE WITH SABRINA AND LET HER KNOW THAT PT NEEDS CARDIAC CLEARANCE AND THAT THEIR OFFICE WILL HAVE TO CALL PT AND SET HIM UP TO HAVE THE STRESS TEST DONE. FAXED THIS INFO FOR CLEARANCE TO DR WOLFF'S  AND DR ARIDA'S OFFICE. I CALLED LEAH IN OR AND LET HER KNOW OF CANCELLATION.

## 2015-05-19 NOTE — Telephone Encounter (Signed)
Left message for Timothy Landry at Pre-admission to notify her of 10/13 stress echo  Clearance for TURP surgery placed in Dr. Tyrell Antonio box. Awaiting 10/13 stress echo. TURP surgery 10/18

## 2015-05-20 ENCOUNTER — Ambulatory Visit: Admission: RE | Admit: 2015-05-20 | Payer: BLUE CROSS/BLUE SHIELD | Source: Ambulatory Visit | Admitting: Urology

## 2015-05-20 ENCOUNTER — Encounter: Admission: RE | Payer: Self-pay | Source: Ambulatory Visit

## 2015-05-20 HISTORY — DX: Other specified health status: Z78.9

## 2015-05-20 SURGERY — GREEN LIGHT LASER TURP (TRANSURETHRAL RESECTION OF PROSTATE
Anesthesia: Choice

## 2015-05-29 ENCOUNTER — Ambulatory Visit (INDEPENDENT_AMBULATORY_CARE_PROVIDER_SITE_OTHER): Payer: BLUE CROSS/BLUE SHIELD

## 2015-05-29 ENCOUNTER — Other Ambulatory Visit: Payer: Self-pay | Admitting: Cardiovascular Disease

## 2015-05-29 DIAGNOSIS — R079 Chest pain, unspecified: Secondary | ICD-10-CM

## 2015-05-29 DIAGNOSIS — I493 Ventricular premature depolarization: Secondary | ICD-10-CM

## 2015-05-29 DIAGNOSIS — R55 Syncope and collapse: Secondary | ICD-10-CM

## 2015-05-29 LAB — ECHOCARDIOGRAM STRESS TEST
CHL CUP MPHR: 165 {beats}/min
CHL CUP STRESS STAGE 1 HR: 71 {beats}/min
CHL CUP STRESS STAGE 1 SPEED: 0 mph
CHL CUP STRESS STAGE 2 DBP: 99 mmHg
CHL CUP STRESS STAGE 2 SBP: 141 mmHg
CHL CUP STRESS STAGE 4 GRADE: 10 %
CHL CUP STRESS STAGE 4 HR: 92 {beats}/min
CHL CUP STRESS STAGE 4 SPEED: 1.7 mph
CHL CUP STRESS STAGE 5 HR: 120 {beats}/min
CHL CUP STRESS STAGE 5 SBP: 180 mmHg
CHL CUP STRESS STAGE 6 DBP: 101 mmHg
CHL CUP STRESS STAGE 6 GRADE: 14 %
CHL CUP STRESS STAGE 7 HR: 166 {beats}/min
CHL CUP STRESS STAGE 8 SPEED: 0 mph
CHL CUP STRESS STAGE 9 DBP: 84 mmHg
CHL CUP STRESS STAGE 9 GRADE: 0 %
CHL CUP STRESS STAGE 9 SBP: 151 mmHg
CSEPED: 9 min
CSEPEDS: 31 s
CSEPEW: 11 METS
CSEPPHR: 166 {beats}/min
CSEPPMHR: 100 %
Percent HR: 102 %
Rest HR: 71 {beats}/min
Stage 1 Grade: 0 %
Stage 2 Grade: 0 %
Stage 2 HR: 67 {beats}/min
Stage 2 Speed: 0 mph
Stage 3 Grade: 0 %
Stage 3 HR: 67 {beats}/min
Stage 3 Speed: 0 mph
Stage 5 DBP: 97 mmHg
Stage 5 Grade: 12 %
Stage 5 Speed: 2.5 mph
Stage 6 HR: 155 {beats}/min
Stage 6 SBP: 208 mmHg
Stage 6 Speed: 3.4 mph
Stage 7 Grade: 16 %
Stage 7 Speed: 4.3 mph
Stage 8 Grade: 0 %
Stage 8 HR: 142 {beats}/min
Stage 9 HR: 105 {beats}/min
Stage 9 Speed: 0 mph

## 2015-05-30 ENCOUNTER — Telehealth: Payer: Self-pay

## 2015-05-30 NOTE — Telephone Encounter (Signed)
Cardiac clearance faxed  

## 2015-06-02 NOTE — H&P (Signed)
Timothy Landry, Timothy Landry                ACCOUNT NO.:  [id number]  MEDICAL RECORD NO.:  [id number]  LOCATION:  PERIO                        FACILITY:  [facility]  PHYSICIAN:  Maryan Puls          DATE OF BIRTH:  1960-09-26  DATE OF ADMISSION:  06/03/2015 DATE OF DISCHARGE:                            HISTORY AND PHYSICAL   CHIEF COMPLAINT:  Urinary retention.  HISTORY OF PRESENT ILLNESS:  The patient is a 54 year old white male, who presented with urinary retention and has been managed with Rapaflo 8 mg a day and self catheterization.  Retention has been persistent, and he comes in now for photovaporization of the prostate with a GreenLight laser.  PAST MEDICAL HISTORY:  No drug allergies.  MEDICATIONS:  Current medications include Rapaflo and allopurinol.  PREVIOUS SURGICAL PROCEDURES: 1. Removal of wrist ganglion cyst. 2. Foot surgery. 3. Ureteroscopic ureterotomy with stent placement in 2014, lithotripsy     in 2014.  PAST AND CURRENT MEDICAL CONDITIONS: 1. Gout. 2. Recurrent kidney stone disease.  REVIEW OF SYSTEMS:  The patient denied chest pain, shortness of breath, diabetes, stroke, or hypertension.  He had cardiac clearance recently and was cleared for this procedure.  FAMILY HISTORY:  Negative for urologic disease.  SOCIAL HISTORY:  The patient denied tobacco use.  He consumes 1-4 alcoholic beverages per week.  PHYSICAL EXAMINATION:  GENERAL:  A well-nourished white male, in no acute distress. HEENT:  Sclerae were clear.  Pupils were equally round, reactive to light and accommodation.  Extraocular movements were intact. NECK:  Supple.  No palpable cervical adenopathy. LUNGS:  Clear to auscultation. CARDIOVASCULAR:  Regular rhythm and rate without audible murmurs. ABDOMEN:  Soft and nontender abdomen. GU:  Circumcised testes, smooth and nontender. RECTAL:  30 grams smooth and nontender prostate. NEUROMUSCULAR:  Alert and oriented x3.  IMPRESSION: 1. Urinary  retention. 2. Benign prostatic hyperplasia.  PLAN:  Photovaporization of prostate with GreenLight laser.          ______________________________ Maryan Puls     MW/MEDQ  D:  06/02/2015  T:  06/02/2015  Job:  [id number]

## 2015-06-03 ENCOUNTER — Encounter: Payer: Self-pay | Admitting: *Deleted

## 2015-06-03 ENCOUNTER — Encounter
Admission: RE | Disposition: A | Discharge status: Home / Self Care | Payer: Self-pay | Source: Ambulatory Visit | Attending: Urology

## 2015-06-03 ENCOUNTER — Ambulatory Visit: Payer: BLUE CROSS/BLUE SHIELD | Admitting: Anesthesiology

## 2015-06-03 ENCOUNTER — Ambulatory Visit
Admission: RE | Admit: 2015-06-03 | Discharge: 2015-06-03 | Disposition: A | Discharge status: Home / Self Care | Payer: BLUE CROSS/BLUE SHIELD | Source: Ambulatory Visit | Attending: Urology | Admitting: Urology

## 2015-06-03 DIAGNOSIS — N138 Other obstructive and reflux uropathy: Secondary | ICD-10-CM

## 2015-06-03 DIAGNOSIS — R338 Other retention of urine: Secondary | ICD-10-CM | POA: Diagnosis not present

## 2015-06-03 DIAGNOSIS — K449 Diaphragmatic hernia without obstruction or gangrene: Secondary | ICD-10-CM | POA: Insufficient documentation

## 2015-06-03 DIAGNOSIS — K219 Gastro-esophageal reflux disease without esophagitis: Secondary | ICD-10-CM | POA: Insufficient documentation

## 2015-06-03 DIAGNOSIS — N210 Calculus in bladder: Secondary | ICD-10-CM | POA: Insufficient documentation

## 2015-06-03 DIAGNOSIS — N401 Enlarged prostate with lower urinary tract symptoms: Secondary | ICD-10-CM | POA: Insufficient documentation

## 2015-06-03 HISTORY — PX: CYSTOSCOPY WITH LITHOLAPAXY: SHX1425

## 2015-06-03 HISTORY — PX: GREEN LIGHT LASER TURP (TRANSURETHRAL RESECTION OF PROSTATE: SHX6260

## 2015-06-03 SURGERY — GREEN LIGHT LASER TURP (TRANSURETHRAL RESECTION OF PROSTATE
Anesthesia: General | Wound class: Clean Contaminated

## 2015-06-03 MED ORDER — LEVOFLOXACIN IN D5W 500 MG/100ML IV SOLN
INTRAVENOUS | Status: AC
  Filled 2015-06-03: qty 100

## 2015-06-03 MED ORDER — ONDANSETRON HCL 4 MG/2ML IJ SOLN
INTRAMUSCULAR | Status: DC | PRN
  Administered 2015-06-03: 4 mg via INTRAVENOUS

## 2015-06-03 MED ORDER — URIBEL 118 MG PO CAPS: 1.0000 | ORAL_CAPSULE | Freq: Four times a day (QID) | ORAL | Status: DC | PRN

## 2015-06-03 MED ORDER — SODIUM CHLORIDE 0.9 % IR SOLN
Status: DC | PRN
  Administered 2015-06-03: 20000 mL

## 2015-06-03 MED ORDER — FENTANYL CITRATE (PF) 100 MCG/2ML IJ SOLN: 25.0000 ug | INTRAMUSCULAR | Status: DC | PRN

## 2015-06-03 MED ORDER — FENTANYL CITRATE (PF) 100 MCG/2ML IJ SOLN
INTRAMUSCULAR | Status: DC | PRN
  Administered 2015-06-03 (×6): 25 ug via INTRAVENOUS

## 2015-06-03 MED ORDER — LIDOCAINE HCL (CARDIAC) 20 MG/ML IV SOLN
INTRAVENOUS | Status: DC | PRN
  Administered 2015-06-03: 50 mg via INTRAVENOUS

## 2015-06-03 MED ORDER — BELLADONNA ALKALOIDS-OPIUM 16.2-60 MG RE SUPP
RECTAL | Status: DC | PRN
  Administered 2015-06-03: 1 via RECTAL

## 2015-06-03 MED ORDER — LEVOFLOXACIN IN D5W 500 MG/100ML IV SOLN
500.0000 mg | Freq: Once | INTRAVENOUS | Status: AC
  Administered 2015-06-03: 500 mg via INTRAVENOUS

## 2015-06-03 MED ORDER — NUCYNTA 50 MG PO TABS: 50.0000 mg | ORAL_TABLET | Freq: Four times a day (QID) | ORAL | Status: DC | PRN

## 2015-06-03 MED ORDER — LIDOCAINE HCL 2 % EX GEL
CUTANEOUS | Status: DC | PRN
  Administered 2015-06-03: 1

## 2015-06-03 MED ORDER — ONDANSETRON HCL 4 MG/2ML IJ SOLN: 4.0000 mg | Freq: Once | INTRAMUSCULAR | Status: DC | PRN

## 2015-06-03 MED ORDER — PROPOFOL 10 MG/ML IV BOLUS
INTRAVENOUS | Status: DC | PRN
  Administered 2015-06-03: 160 mg via INTRAVENOUS

## 2015-06-03 MED ORDER — LIDOCAINE HCL 2 % EX GEL
CUTANEOUS | Status: AC
  Filled 2015-06-03: qty 10

## 2015-06-03 MED ORDER — BELLADONNA ALKALOIDS-OPIUM 16.2-60 MG RE SUPP
RECTAL | Status: AC
  Filled 2015-06-03: qty 1

## 2015-06-03 MED ORDER — MIDAZOLAM HCL 5 MG/5ML IJ SOLN
INTRAMUSCULAR | Status: DC | PRN
  Administered 2015-06-03: 2 mg via INTRAVENOUS

## 2015-06-03 MED ORDER — LACTATED RINGERS IV SOLN
INTRAVENOUS | Status: DC
  Administered 2015-06-03 (×2): via INTRAVENOUS

## 2015-06-03 MED ORDER — ONDANSETRON 8 MG PO TBDP: 8.0000 mg | ORAL_TABLET | Freq: Four times a day (QID) | ORAL | Status: DC | PRN

## 2015-06-03 MED ORDER — DOCUSATE SODIUM 100 MG PO CAPS: 200.0000 mg | ORAL_CAPSULE | Freq: Two times a day (BID) | ORAL | Status: DC

## 2015-06-03 MED ORDER — EPHEDRINE SULFATE 50 MG/ML IJ SOLN
INTRAMUSCULAR | Status: DC | PRN
  Administered 2015-06-03: 5 mg via INTRAVENOUS
  Administered 2015-06-03: 10 mg via INTRAVENOUS
  Administered 2015-06-03: 5 mg via INTRAVENOUS

## 2015-06-03 MED ORDER — LEVOFLOXACIN 500 MG PO TABS: 500.0000 mg | ORAL_TABLET | Freq: Every day | ORAL | Status: DC

## 2015-06-03 SURGICAL SUPPLY — 38 items
ADAPTER IRRIG TUBE 2 SPIKE SOL (ADAPTER) ×8 IMPLANT
ADPR TBG 2 SPK PMP STRL ASCP (ADAPTER) ×4
BAG URO DRAIN 2000ML W/SPOUT (MISCELLANEOUS) ×4 IMPLANT
CATH FOLEY 2WAY  5CC 20FR SIL (CATHETERS) ×2
CATH FOLEY 2WAY 5CC 20FR SIL (CATHETERS) ×2 IMPLANT
CNTNR SPEC 2.5X3XGRAD LEK (MISCELLANEOUS) ×2
CONT SPEC 4OZ STER OR WHT (MISCELLANEOUS) ×2
CONT SPEC 4OZ STRL OR WHT (MISCELLANEOUS) ×2
CONTAINER SPEC 2.5X3XGRAD LEK (MISCELLANEOUS) IMPLANT
FEE TECHNICIAN ONLY PER HOUR (MISCELLANEOUS) ×2 IMPLANT
GLOVE BIO SURGEON STRL SZ7 (GLOVE) ×8 IMPLANT
GLOVE BIO SURGEON STRL SZ7.5 (GLOVE) ×4 IMPLANT
GOWN STRL REUS W/ TWL LRG LVL3 (GOWN DISPOSABLE) ×2 IMPLANT
GOWN STRL REUS W/ TWL XL LVL3 (GOWN DISPOSABLE) ×2 IMPLANT
GOWN STRL REUS W/TWL LRG LVL3 (GOWN DISPOSABLE) ×4
GOWN STRL REUS W/TWL XL LVL3 (GOWN DISPOSABLE) ×4
IV NS 1000ML (IV SOLUTION) ×4
IV NS 1000ML BAXH (IV SOLUTION) ×2 IMPLANT
IV SET PRIMARY 15D 139IN B9900 (IV SETS) ×4 IMPLANT
KIT RM TURNOVER CYSTO AR (KITS) ×4 IMPLANT
LASER FIBER HOLM 550 SUREFLEX (Laser) ×2 IMPLANT
LASER GREENLIGHT XPS PROCEDURE (MISCELLANEOUS) ×2 IMPLANT
LASER GRNLGT 950 (MISCELLANEOUS) ×2 IMPLANT
LASER GRNLGT MOXY FIBER 750UM (MISCELLANEOUS) ×4 IMPLANT
LASER HOLMIUM FIBER SU 272UM (MISCELLANEOUS) IMPLANT
PACK CYSTO AR (MISCELLANEOUS) ×4 IMPLANT
PREP PVP WINGED SPONGE (MISCELLANEOUS) ×4 IMPLANT
SET IRRIG Y TYPE TUR BLADDER L (SET/KITS/TRAYS/PACK) ×4 IMPLANT
SOL .9 NS 3000ML IRR  AL (IV SOLUTION) ×16
SOL .9 NS 3000ML IRR AL (IV SOLUTION) ×16
SOL .9 NS 3000ML IRR UROMATIC (IV SOLUTION) ×8 IMPLANT
SOL PREP PVP 2OZ (MISCELLANEOUS) ×4
SOLUTION PREP PVP 2OZ (MISCELLANEOUS) ×2 IMPLANT
SURGILUBE 2OZ TUBE FLIPTOP (MISCELLANEOUS) ×4 IMPLANT
SYRINGE IRR TOOMEY STRL 70CC (SYRINGE) ×4 IMPLANT
TUBING CONNECTING 10 (TUBING) ×3 IMPLANT
TUBING CONNECTING 10' (TUBING) ×1
WATER STERILE IRR 1000ML POUR (IV SOLUTION) ×4 IMPLANT

## 2015-06-03 NOTE — Anesthesia Preprocedure Evaluation (Signed)
Anesthesia Evaluation  Patient identified by MRN, date of birth, ID band Patient awake    Reviewed: Allergy & Precautions, H&P , NPO status , Patient's Chart, lab work & pertinent test results, reviewed documented beta blocker date and time   Airway Mallampati: II  TM Distance: >3 FB Neck ROM: full    Dental  (+) Teeth Intact   Pulmonary neg pulmonary ROS,    Pulmonary exam normal        Cardiovascular Exercise Tolerance: Good negative cardio ROS Normal cardiovascular exam Rate:Normal     Neuro/Psych  Neuromuscular disease negative neurological ROS  negative psych ROS   GI/Hepatic negative GI ROS, Neg liver ROS, hiatal hernia, GERD  ,  Endo/Other  negative endocrine ROS  Renal/GU Renal diseasenegative Renal ROS  negative genitourinary   Musculoskeletal   Abdominal   Peds  Hematology negative hematology ROS (+)   Anesthesia Other Findings   Reproductive/Obstetrics negative OB ROS                             Anesthesia Physical Anesthesia Plan  ASA: II  Anesthesia Plan: General LMA   Post-op Pain Management:    Induction:   Airway Management Planned:   Additional Equipment:   Intra-op Plan:   Post-operative Plan:   Informed Consent: I have reviewed the patients History and Physical, chart, labs and discussed the procedure including the risks, benefits and alternatives for the proposed anesthesia with the patient or authorized representative who has indicated his/her understanding and acceptance.     Plan Discussed with: CRNA  Anesthesia Plan Comments:         Anesthesia Quick Evaluation

## 2015-06-03 NOTE — Discharge Instructions (Addendum)
Acute Urinary Retention, Male Acute urinary retention is the temporary inability to urinate. This is a common problem in older men. As men age their prostates become larger and block the flow of urine from the bladder. This is usually a problem that has come on gradually.  HOME CARE INSTRUCTIONS If you are sent home with a Foley catheter and a drainage system, you will need to discuss the best course of action with your health care provider. While the catheter is in, maintain a good intake of fluids. Keep the drainage bag emptied and lower than your catheter. This is so that contaminated urine will not flow back into your bladder, which could lead to a urinary tract infection. There are two main types of drainage bags. One is a large bag that usually is used at night. It has a good capacity that will allow you to sleep through the night without having to empty it. The second type is called a leg bag. It has a smaller capacity, so it needs to be emptied more frequently. However, the main advantage is that it can be attached by a leg strap and can go underneath your clothing, allowing you the freedom to move about or leave your home. Only take over-the-counter or prescription medicines for pain, discomfort, or fever as directed by your health care provider.  SEEK MEDICAL CARE IF:  You develop a low-grade fever.  You experience spasms or leakage of urine with the spasms. SEEK IMMEDIATE MEDICAL CARE IF:   You develop chills or fever.  Your catheter stops draining urine.  Your catheter falls out.  You start to develop increased bleeding that does not respond to rest and increased fluid intake. MAKE SURE YOU:  Understand these instructions.  Will watch your condition.  Will get help right away if you are not doing well or get worse.   This information is not intended to replace advice given to you by your health care provider. Make sure you discuss any questions you have with your health care  provider.   Document Released: 11/08/2000 Document Revised: 12/17/2014 Document Reviewed: 01/11/2013 Elsevier Interactive Patient Education 2016 [place] Light Laser Prostate Treatment Nils Thor light laser therapy is a procedure that uses a special high-energy laser for vaporizing extra prostate tissue. It is less invasive than traditional methods of prostate surgery, which involve cutting out the prostate tissue. Because the tissue is vaporized rather than cut out there is generally less blood loss. LET [facility] CARE PROVIDER KNOW ABOUT:  Any allergies you have.  Any medicines you are taking, including vitamins, herbs, eye drops, creams, and over-the-counter medication.  Previous problems you or members of your family have had with the use of anesthetics.  Any blood disorders you have.  Previous surgeries you have had.  Medical conditions you have. RISKS AND COMPLICATIONS Generally, Jaanai Salemi light laser prostate treatment is a safe procedure. However, as with any procedure, complications can occur. Possible complications include:  Urinary tract infection.  Erectile dysfunction (rare).  Dry ejaculation--Semen is not released when you reach sexual climax.  Scar tissue in the urinary passage. BEFORE THE PROCEDURE   Your health care provider may discuss medicines you are taking and may advise you to stop taking specific ones.  You may be given antibiotic medicine to take as a precaution against bacterial infection.  Do not eat or drink anything for 8 hours before your procedure or as directed by your health care provider. You may have a sip of  water to take any necessary medicines. PROCEDURE Depending on the size and shape of your prostate, the procedure may take 30-60 minutes. You will be given one of the following:   A medicine that makes you go to sleep (general anesthetic).  A medicine injected into your spine that numbs your body below the waist (spinal  anesthetic). Sedation is usually given with spinal anesthetic so you will be relaxed. A tube containing viewing scopes and instruments will be inserted through your penis so that no cuts (incisions) are needed. A thin fiber is put through the tube and positioned next to the excess prostate tissue. Pulses of laser light come from the end of the fiber and are projected onto the excess tissue. The laser beam is absorbed by your blood, which becomes hot enough to vaporize the excess prostate tissue. This laser beam will seal off the blood vessels, decreasing bleeding. The tube with the viewing scopes, instruments, and thin fiber will be removed and replaced with a temporary catheter. AFTER THE PROCEDURE  After the surgery, you will be sent to the recovery room for a short time. Depending on factors such as the amount of prostate tissue vaporized, the strength of your bladder, and the amount of bleeding expected, the catheter may be removed. Generally, overnight stay is not needed and you will be sent home on the same day as the procedure. You may be sent home with elastic support stockings to help prevent blood clots in your legs.    This information is not intended to replace advice given to you by your health care provider. Make sure you discuss any questions you have with your health care provider.   Document Released: 11/09/2007 Document Revised: 08/07/2013 Document Reviewed: 01/22/2013 Elsevier Interactive Patient Education 2016 [place] Anesthesia, Adult, Care After Refer to this sheet in the next few weeks. These instructions provide you with information on caring for yourself after your procedure. Your health care provider may also give you more specific instructions. Your treatment has been planned according to current medical practices, but problems sometimes occur. Call your health care provider if you have any problems or questions after your procedure. WHAT TO EXPECT AFTER THE  PROCEDURE After the procedure, it is typical to experience:  Sleepiness.  Nausea and vomiting. HOME CARE INSTRUCTIONS  For the first 24 hours after general anesthesia:  Have a responsible person with you.  Do not drive a car. If you are alone, do not take public transportation.  Do not drink alcohol.  Do not take medicine that has not been prescribed by your health care provider.  Do not sign important papers or make important decisions.  You may resume a normal diet and activities as directed by your health care provider.  Change bandages (dressings) as directed.  If you have questions or problems that seem related to general anesthesia, call the hospital and ask for the anesthetist or anesthesiologist on call. SEEK MEDICAL CARE IF:  You have nausea and vomiting that continue the day after anesthesia.  You develop a rash. SEEK IMMEDIATE MEDICAL CARE IF:   You have difficulty breathing.  You have chest pain.  You have any allergic problems.   This information is not intended to replace advice given to you by your health care provider. Make sure you discuss any questions you have with your health care provider.   Document Released: 11/08/2000 Document Revised: 08/23/2014 Document Reviewed: 12/01/2011 Elsevier Interactive Patient Education [employer].

## 2015-06-03 NOTE — Op Note (Signed)
Preoperative diagnosis: Urinary retention Postoperative diagnosis: 1. Urinary retention                                              2. Bladder stone                                              3. BPH  Procedure: 1. Photovaporization of the prostate with greenlight laser                      2.  Lithopaxy of bladder stone with holmium laser Surgeon: Otelia Limes. Yves Dill MD, FACS Anesthesia: Gen.  Indications:See the history and physical. After informed consent the above procedure(s) were requested     Technique and findings: After adequate general anesthesia had been obtained the patient was placed into dorsal lithotomy position and the perineum was prepped and draped in the usual fashion. The laser scope was coupled to the camera and visually advanced into the bladder. No bladder tumors were identified. The bladder was heavily trabeculated with small diverticulae and cellules present. Patient had trilobar BPH with visual obstruction. Both ureteral orifices were identified and had clear efflux. No bladder tumors were identified. A 10 mm latter stone was found. The stone was disintegrated with a 500  holmium laser fiber. The fragments were evacuated with [facility] syringe. At this point the greenlight XPS laser fiber was introduced through the scope. Initial power setting was 80 W. The bladder neck tissue including median lobe was vaporized. Power was then increased to 120 W and remaining obstructive tissue from the bladder neck to the verumontanum was vaporized. At this point any bleeding vessels were coagulated. The scope was then removed and 10 cc of viscous Xylocaine instilled within the urethra and bladder. A 20 French silicone catheter was placed and irrigated until clear. A B&O suppository was placed. The procedure was then terminated and patient transferred to the recovery room in stable condition.

## 2015-06-03 NOTE — Anesthesia Procedure Notes (Signed)
Procedure Name: LMA Insertion Date/Time: 06/03/2015 1:22 PM Performed by: Dionne Bucy Pre-anesthesia Checklist: Patient identified, Patient being monitored, Timeout performed, Emergency Drugs available and Suction available Patient Re-evaluated:Patient Re-evaluated prior to inductionOxygen Delivery Method: Circle system utilized Preoxygenation: Pre-oxygenation with 100% oxygen Intubation Type: IV induction Ventilation: Mask ventilation without difficulty LMA: LMA inserted LMA Size: 5.0 Tube type: Oral Number of attempts: 1 Placement Confirmation: positive ETCO2 and breath sounds checked- equal and bilateral Tube secured with: Tape Dental Injury: Teeth and Oropharynx as per pre-operative assessment

## 2015-06-03 NOTE — H&P (Signed)
Date of Initial H&P: 06/02/15  History reviewed, patient examined, no change in status, stable for surgery.

## 2015-06-03 NOTE — Transfer of Care (Signed)
Immediate Anesthesia Transfer of Care Note  Patient: Timothy Landry  Procedure(s) Performed: Procedure(s): GREEN LIGHT LASER TURP (TRANSURETHRAL RESECTION OF PROSTATE (N/A) CYSTOSCOPY WITH LITHOLAPAXY WITH HOLMIUM LASER   Patient Location: PACU  Anesthesia Type:General  Level of Consciousness: sedated  Airway & Oxygen Therapy: Patient Spontanous Breathing and Patient connected to face mask oxygen  Post-op Assessment: Report given to RN and Post -op Vital signs reviewed and stable  Post vital signs: Reviewed and stable  Last Vitals:  Filed Vitals:   06/03/15 1513  BP: 147/93  Pulse: 86  Temp: 36.3 C  Resp: 16    Complications: No apparent anesthesia complications

## 2015-06-04 ENCOUNTER — Encounter: Payer: Self-pay | Admitting: Urology

## 2015-06-05 ENCOUNTER — Encounter: Payer: Self-pay | Admitting: Urology

## 2015-06-05 NOTE — Anesthesia Postprocedure Evaluation (Signed)
  Anesthesia Post-op Note  Patient: Timothy Landry  Procedure(s) Performed: Procedure(s): GREEN LIGHT LASER TURP (TRANSURETHRAL RESECTION OF PROSTATE (N/A) CYSTOSCOPY WITH LITHOLAPAXY WITH HOLMIUM LASER   Anesthesia type:General LMA  Patient location: PACU  Post pain: Pain level controlled  Post assessment: Post-op Vital signs reviewed, Patient's Cardiovascular Status Stable, Respiratory Function Stable, Patent Airway and No signs of Nausea or vomiting  Post vital signs: Reviewed and stable  Last Vitals:  Filed Vitals:   06/03/15 1656  BP: 155/104  Pulse: 59  Temp:   Resp: 16    Level of consciousness: awake, alert  and patient cooperative  Complications: No apparent anesthesia complications

## 2015-06-14 ENCOUNTER — Encounter: Payer: Self-pay | Admitting: Family Medicine

## 2015-06-14 DIAGNOSIS — N400 Enlarged prostate without lower urinary tract symptoms: Secondary | ICD-10-CM | POA: Insufficient documentation

## 2015-06-16 ENCOUNTER — Encounter: Payer: Self-pay | Admitting: Family Medicine

## 2015-09-20 ENCOUNTER — Other Ambulatory Visit: Payer: Self-pay | Admitting: Family Medicine

## 2015-09-23 ENCOUNTER — Telehealth: Payer: Self-pay

## 2015-09-23 NOTE — Telephone Encounter (Signed)
Pt had June OV w/Dr. Fletcher Anon. At that time, 48 hour HM and stress echo ordered. Pt had stress echo but never had HM placed. I called pt today who states "I am doing great" and declines HM at this time. Advised pt to contact us if he becomes symptomatic and would like HM placed. Pt verbalized understanding and is agreeable w/plan.

## 2016-06-13 ENCOUNTER — Other Ambulatory Visit: Payer: Self-pay | Admitting: Family Medicine

## 2016-07-14 ENCOUNTER — Other Ambulatory Visit: Payer: Self-pay | Admitting: Family Medicine

## 2016-08-13 ENCOUNTER — Encounter: Payer: BLUE CROSS/BLUE SHIELD | Admitting: Family Medicine

## 2016-08-26 ENCOUNTER — Ambulatory Visit (INDEPENDENT_AMBULATORY_CARE_PROVIDER_SITE_OTHER): Payer: 59 | Admitting: Family Medicine

## 2016-08-26 ENCOUNTER — Encounter: Payer: Self-pay | Admitting: Family Medicine

## 2016-08-26 VITALS — BP 154/104 | HR 64 | Temp 97.7°F | Ht 68.0 in | Wt 212.0 lb

## 2016-08-26 DIAGNOSIS — Z0000 Encounter for general adult medical examination without abnormal findings: Secondary | ICD-10-CM | POA: Diagnosis not present

## 2016-08-26 DIAGNOSIS — Z23 Encounter for immunization: Secondary | ICD-10-CM | POA: Diagnosis not present

## 2016-08-26 DIAGNOSIS — Z1159 Encounter for screening for other viral diseases: Secondary | ICD-10-CM

## 2016-08-26 DIAGNOSIS — I10 Essential (primary) hypertension: Secondary | ICD-10-CM | POA: Insufficient documentation

## 2016-08-26 DIAGNOSIS — N400 Enlarged prostate without lower urinary tract symptoms: Secondary | ICD-10-CM | POA: Diagnosis not present

## 2016-08-26 DIAGNOSIS — E785 Hyperlipidemia, unspecified: Secondary | ICD-10-CM

## 2016-08-26 DIAGNOSIS — R55 Syncope and collapse: Secondary | ICD-10-CM

## 2016-08-26 DIAGNOSIS — R030 Elevated blood-pressure reading, without diagnosis of hypertension: Secondary | ICD-10-CM

## 2016-08-26 DIAGNOSIS — M1A9XX0 Chronic gout, unspecified, without tophus (tophi): Secondary | ICD-10-CM | POA: Diagnosis not present

## 2016-08-26 LAB — COMPREHENSIVE METABOLIC PANEL
ALK PHOS: 58 U/L (ref 39–117)
ALT: 19 U/L (ref 0–53)
AST: 16 U/L (ref 0–37)
Albumin: 4.2 g/dL (ref 3.5–5.2)
BUN: 14 mg/dL (ref 6–23)
CHLORIDE: 106 meq/L (ref 96–112)
CO2: 31 mEq/L (ref 19–32)
Calcium: 9.4 mg/dL (ref 8.4–10.5)
Creatinine, Ser: 0.9 mg/dL (ref 0.40–1.50)
GFR: 92.53 mL/min (ref >60.00)
GLUCOSE: 89 mg/dL (ref 70–99)
POTASSIUM: 4.4 meq/L (ref 3.5–5.1)
SODIUM: 144 meq/L (ref 135–145)
TOTAL PROTEIN: 6.7 g/dL (ref 6.0–8.3)
Total Bilirubin: 1.3 mg/dL — ABNORMAL HIGH (ref 0.2–1.2)

## 2016-08-26 LAB — PSA: PSA: 0.36 ng/mL (ref 0.10–4.00)

## 2016-08-26 LAB — HEPATITIS C ANTIBODY: HCV AB: NEGATIVE

## 2016-08-26 LAB — LIPID PANEL
Cholesterol: 182 mg/dL (ref 0–200)
HDL: 43.4 mg/dL (ref >39.00)
LDL Cholesterol: 116 mg/dL — ABNORMAL HIGH (ref 0–99)
NONHDL: 138.43
Total CHOL/HDL Ratio: 4
Triglycerides: 113 mg/dL (ref 0.0–149.0)
VLDL: 22.6 mg/dL (ref 0.0–40.0)

## 2016-08-26 LAB — URIC ACID: URIC ACID, SERUM: 4.6 mg/dL (ref 4.0–7.8)

## 2016-08-26 LAB — TSH: TSH: 0.56 u[IU]/mL (ref 0.35–4.50)

## 2016-08-26 MED ORDER — ALLOPURINOL 300 MG PO TABS: 300.0000 mg | ORAL_TABLET | Freq: Every day | ORAL | 3 refills | Status: DC

## 2016-08-26 NOTE — Assessment & Plan Note (Signed)
Chronic, stable. Continue allopurinol.  

## 2016-08-26 NOTE — Assessment & Plan Note (Signed)
Update FLP.  

## 2016-08-26 NOTE — Assessment & Plan Note (Signed)
Preventative protocols reviewed and updated unless pt declined. Discussed healthy diet and lifestyle.  

## 2016-08-26 NOTE — Progress Notes (Signed)
BP (!) 154/104 (BP Location: Right Arm, Cuff Size: Large)   Pulse 64   Temp 97.7 F (36.5 C) (Oral)   Ht 5\' 8"  (1.727 m)   Wt 212 lb (96.2 kg)   BMI 32.23 kg/m    CC: CPE Subjective:    Patient ID: Timothy Landry, male    DOB: 06-17-1961, 56 y.o.   MRN: [id number]  HPI: Timothy Landry is a 56 y.o. male presenting on 08/26/2016 for Annual Exam   Syncope episode 2016 s/p unrevealing cardiac evaluation (stress echo). holter ordered but not done.  Gout - stable on allopurinol 300mg  daily.  Nephrolithiasis - salt-related  Preventative: COLONOSCOPY 04/2012 mod diverticulosis, 3 polyps - benign, rec rpt 10 yrs ([facility]) Prostate screening - s/p green laser TURP by Dr Yves Dill - sees regularly. Requests PSA today Flu shot yearly Tetanus 2009.  Seat belt use discussed.  Sunscreen use dicussed. 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 Interim medical history since our last visit reviewed. Allergies and medications reviewed and updated. Current Outpatient Prescriptions on File Prior to Visit  Medication Sig  . omeprazole (PRILOSEC OTC) 20 MG tablet Take 20 mg by mouth every morning.    No current facility-administered medications on file prior to visit.     Review of Systems  Constitutional: Negative for activity change, appetite change, chills, fatigue, fever and unexpected weight change.  HENT: Negative for hearing loss.   Eyes: Negative for visual disturbance.  Respiratory: Negative for cough, chest tightness, shortness of breath and wheezing.   Cardiovascular: Negative for chest pain, palpitations and leg swelling.  Gastrointestinal: Negative for abdominal distention, abdominal pain,  blood in stool, constipation, diarrhea, nausea and vomiting.  Genitourinary: Negative for difficulty urinating and hematuria.  Musculoskeletal: Negative for arthralgias, myalgias and neck pain.  Skin: Negative for rash.  Neurological: Negative for dizziness, seizures, syncope and headaches.  Hematological: Negative for adenopathy. Does not bruise/bleed easily.  Psychiatric/Behavioral: Negative for dysphoric mood. The patient is not nervous/anxious.    Per HPI unless specifically indicated in ROS section     Objective:    BP (!) 154/104 (BP Location: Right Arm, Cuff Size: Large)   Pulse 64   Temp 97.7 F (36.5 C) (Oral)   Ht 5\' 8"  (1.727 m)   Wt 212 lb (96.2 kg)   BMI 32.23 kg/m   Wt Readings from Last 3 Encounters:  08/26/16 212 lb (96.2 kg)  06/03/15 210 lb (95.3 kg)  01/22/15 210 lb 4 oz (95.4 kg)    Physical Exam  Constitutional: He is oriented to person, place, and time. He appears well-developed and well-nourished. No distress.  HENT:  Head: Normocephalic and atraumatic.  Right Ear: Hearing, tympanic membrane, external ear and ear canal normal.  Left Ear: Hearing, tympanic membrane, external ear and ear canal normal.  Nose: Nose normal.  Mouth/Throat: Uvula is midline, oropharynx is clear and moist and mucous membranes are normal. No oropharyngeal exudate, posterior oropharyngeal edema or posterior oropharyngeal erythema.  Eyes: Conjunctivae and EOM are normal. Pupils are equal, round, and reactive to light. No scleral icterus.  Neck: Normal range of motion. Neck supple. No thyromegaly present.  Cardiovascular: Normal rate, regular rhythm, normal heart sounds  and intact distal pulses.   No murmur heard. Pulses:      Radial pulses are 2+ on the right side, and 2+ on the left side.  Pulmonary/Chest: Effort normal and breath sounds normal. No respiratory distress. He has no wheezes. He has no rales.  Abdominal: Soft. Bowel sounds are normal. He exhibits no distension and no  mass. There is no tenderness. There is no rebound and no guarding.  Musculoskeletal: Normal range of motion. He exhibits no edema.  Lymphadenopathy:    He has no cervical adenopathy.  Neurological: He is alert and oriented to person, place, and time.  CN grossly intact, station and gait intact  Skin: Skin is warm and dry. No rash noted.  Psychiatric: He has a normal mood and affect. His behavior is normal. Judgment and thought content normal.  Nursing note and vitals reviewed.     Assessment & Plan:   Problem List Items Addressed This Visit    Benign prostatic hyperplasia    Followed by urology Dr Yves Dill      Relevant Orders   PSA   Elevated blood-pressure reading without diagnosis of hypertension    Will start monitoring at home, discussed healthy diet and lifestyle changes to improve readings.  RTC 1 mo BP recheck.       Gout    Chronic, stable. Continue allopurinol.      Relevant Orders   Uric acid   Healthcare maintenance - Primary    Preventative protocols reviewed and updated unless pt declined. Discussed healthy diet and lifestyle.       HLD (hyperlipidemia)    Update FLP       Relevant Orders   Lipid panel   Comprehensive metabolic panel   TSH   RESOLVED: Syncope    No further passing out, ?anxiety related. S/p normal cardiac evaluation.        Other Visit Diagnoses    Need for influenza vaccination       Relevant Orders   Flu Vaccine QUAD 36+ mos PF IM (Fluarix & Fluzone Quad PF) (Completed)   Need for hepatitis C screening test       Relevant Orders   Hepatitis C antibody       Follow up plan: Return in about 4 weeks (around 09/23/2016) for follow up visit.  Timothy Bush, MD

## 2016-08-26 NOTE — Patient Instructions (Addendum)
Flu shot today Labs today  You are doing well today Work on healthy diet - increased water, fruits/vegetables.  Return as needed or in 1 year for next physical. Keep an eye on blood pressure at home. Work on increased aerobic exercise, increase water, limit salt and sodium in the diet. Return in 1-2 months for bp recheck.   Health Maintenance, Male A healthy lifestyle and preventative care can promote health and wellness.  Maintain regular health, dental, and eye exams.  Eat a healthy diet. Foods like vegetables, fruits, whole grains, low-fat dairy products, and lean protein foods contain the nutrients you need and are low in calories. Decrease your intake of foods high in solid fats, added sugars, and salt. Get information about a proper diet from your health care provider, if necessary.  Regular physical exercise is one of the most important things you can do for your health. Most adults should get at least 150 minutes of moderate-intensity exercise (any activity that increases your heart rate and causes you to sweat) each week. In addition, most adults need muscle-strengthening exercises on 2 or more days a week.   Maintain a healthy weight. The body mass index (BMI) is a screening tool to identify possible weight problems. It provides an estimate of body fat based on height and weight. Your health care provider can find your BMI and can help you achieve or maintain a healthy weight. For males 20 years and older:  A BMI below 18.5 is considered underweight.  A BMI of 18.5 to 24.9 is normal.  A BMI of 25 to 29.9 is considered overweight.  A BMI of 30 and above is considered obese.  Maintain normal blood lipids and cholesterol by exercising and minimizing your intake of saturated fat. Eat a balanced diet with plenty of fruits and vegetables. Blood tests for lipids and cholesterol should begin at age 24 and be repeated every 5 years. If your lipid or cholesterol levels are high, you are  over age 23, or you are at high risk for heart disease, you may need your cholesterol levels checked more frequently.Ongoing high lipid and cholesterol levels should be treated with medicines if diet and exercise are not working.  If you smoke, find out from your health care provider how to quit. If you do not use tobacco, do not start.  Lung cancer screening is recommended for adults aged 9-80 years who are at high risk for developing lung cancer because of a history of smoking. A yearly low-dose CT scan of the lungs is recommended for people who have at least a 30-pack-year history of smoking and are current smokers or have quit within the past 15 years. A pack year of smoking is smoking an average of 1 pack of cigarettes a day for 1 year (for example, a 30-pack-year history of smoking could mean smoking 1 pack a day for 30 years or 2 packs a day for 15 years). Yearly screening should continue until the smoker has stopped smoking for at least 15 years. Yearly screening should be stopped for people who develop a health problem that would prevent them from having lung cancer treatment.  If you choose to drink alcohol, do not have more than 2 drinks per day. One drink is considered to be 12 oz (360 mL) of beer, 5 oz (150 mL) of wine, or 1.5 oz (45 mL) of liquor.  Avoid the use of street drugs. Do not share needles with anyone. Ask for help if you need  support or instructions about stopping the use of drugs.  High blood pressure causes heart disease and increases the risk of stroke. High blood pressure is more likely to develop in:  People who have blood pressure in the end of the normal range (100-139/85-89 mm Hg).  People who are overweight or obese.  People who are African American.  If you are 51-75 years of age, have your blood pressure checked every 3-5 years. If you are 62 years of age or older, have your blood pressure checked every year. You should have your blood pressure measured  twice-once when you are at a hospital or clinic, and once when you are not at a hospital or clinic. Record the average of the two measurements. To check your blood pressure when you are not at a hospital or clinic, you can use:  An automated blood pressure machine at a pharmacy.  A home blood pressure monitor.  If you are 80-54 years old, ask your health care provider if you should take aspirin to prevent heart disease.  Diabetes screening involves taking a blood sample to check your fasting blood sugar level. This should be done once every 3 years after age 37 if you are at a normal weight and without risk factors for diabetes. Testing should be considered at a younger age or be carried out more frequently if you are overweight and have at least 1 risk factor for diabetes.  Colorectal cancer can be detected and often prevented. Most routine colorectal cancer screening begins at the age of 17 and continues through age 22. However, your health care provider may recommend screening at an earlier age if you have risk factors for colon cancer. On a yearly basis, your health care provider may provide home test kits to check for hidden blood in the stool. A small camera at the end of a tube may be used to directly examine the colon (sigmoidoscopy or colonoscopy) to detect the earliest forms of colorectal cancer. Talk to your health care provider about this at age 72 when routine screening begins. A direct exam of the colon should be repeated every 5-10 years through age 93, unless early forms of precancerous polyps or small growths are found.  People who are at an increased risk for hepatitis B should be screened for this virus. You are considered at high risk for hepatitis B if:  You were born in a country where hepatitis B occurs often. Talk with your health care provider about which countries are considered high risk.  Your parents were born in a high-risk country and you have not received a shot to  protect against hepatitis B (hepatitis B vaccine).  You have HIV or AIDS.  You use needles to inject street drugs.  You live with, or have sex with, someone who has hepatitis B.  You are a man who has sex with other men (MSM).  You get hemodialysis treatment.  You take certain medicines for conditions like cancer, organ transplantation, and autoimmune conditions.  Hepatitis C blood testing is recommended for all people born from [birth year] through 1966 and any individual with known risk factors for hepatitis C.  Healthy men should no longer receive prostate-specific antigen (PSA) blood tests as part of routine cancer screening. Talk to your health care provider about prostate cancer screening.  Testicular cancer screening is not recommended for adolescents or adult males who have no symptoms. Screening includes self-exam, a health care provider exam, and other screening tests. Consult with your  health care provider about any symptoms you have or any concerns you have about testicular cancer.  Practice safe sex. Use condoms and avoid high-risk sexual practices to reduce the spread of sexually transmitted infections (STIs).  You should be screened for STIs, including gonorrhea and chlamydia if:  You are sexually active and are younger than 24 years.  You are older than 24 years, and your health care provider tells you that you are at risk for this type of infection.  Your sexual activity has changed since you were last screened, and you are at an increased risk for chlamydia or gonorrhea. Ask your health care provider if you are at risk.  If you are at risk of being infected with HIV, it is recommended that you take a prescription medicine daily to prevent HIV infection. This is called pre-exposure prophylaxis (PrEP). You are considered at risk if:  You are a man who has sex with other men (MSM).  You are a heterosexual man who is sexually active with multiple partners.  You take drugs by  injection.  You are sexually active with a partner who has HIV.  Talk with your health care provider about whether you are at high risk of being infected with HIV. If you choose to begin PrEP, you should first be tested for HIV. You should then be tested every 3 months for as long as you are taking PrEP.  Use sunscreen. Apply sunscreen liberally and repeatedly throughout the day. You should seek shade when your shadow is shorter than you. Protect yourself by wearing long sleeves, pants, a wide-brimmed hat, and sunglasses year round whenever you are outdoors.  Tell your health care provider of new moles or changes in moles, especially if there is a change in shape or color. Also, tell your health care provider if a mole is larger than the size of a pencil eraser.  A one-time screening for abdominal aortic aneurysm (AAA) and surgical repair of large AAAs by ultrasound is recommended for men aged 21-75 years who are current or former smokers.  Stay current with your vaccines (immunizations). This information is not intended to replace advice given to you by your health care provider. Make sure you discuss any questions you have with your health care provider. Document Released: 01/29/2008 Document Revised: 08/23/2014 Document Reviewed: 05/06/2015 Elsevier Interactive Patient Education  2017 [employer].

## 2016-08-26 NOTE — Assessment & Plan Note (Addendum)
Will start monitoring at home, discussed healthy diet and lifestyle changes to improve readings.  RTC 1 mo BP recheck.

## 2016-08-26 NOTE — Assessment & Plan Note (Signed)
Followed by urology (Dr Wolff).  ?

## 2016-08-26 NOTE — Assessment & Plan Note (Signed)
No further passing out, ?anxiety related. S/p normal cardiac evaluation.

## 2016-08-26 NOTE — Progress Notes (Signed)
Pre visit review using our clinic review tool, if applicable. No additional management support is needed unless otherwise documented below in the visit note. 

## 2016-08-30 ENCOUNTER — Encounter: Payer: Self-pay | Admitting: *Deleted

## 2016-10-01 MED FILL — ALLOPURINOL 300 MG TABLET: 300 | 90 days supply | Qty: 90 | Fill #0

## 2017-01-28 MED FILL — ALLOPURINOL 300 MG TABLET: 300 | 90 days supply | Qty: 90 | Fill #1

## 2017-04-28 ENCOUNTER — Telehealth: Payer: Self-pay

## 2017-04-28 ENCOUNTER — Other Ambulatory Visit: Payer: Self-pay | Admitting: Family Medicine

## 2017-04-28 MED FILL — ALLOPURINOL 300 MG TABLET: 300 | 90 days supply | Qty: 90 | Fill #0

## 2017-04-28 NOTE — Telephone Encounter (Signed)
Per DPR left v/m that pt can ck with [facility] outpt pharmacy that refill for allopurinol sent to [facility].

## 2017-04-28 NOTE — Telephone Encounter (Signed)
Pt request refills allopurinol to [facility] out pt pharmacy;advised pt has available refills at [facility] and pt will have [facility] out[facility] contact [facility] for transfer of refills.

## 2017-04-28 NOTE — Telephone Encounter (Signed)
pts wife left v/m that not enough refills were transferred from [facility]; I spoke with Timothy Landry at [facility] out pt and she said [facility] and only 2 refills were transferred to [facility] outpt instead of 3 refills. One refill sent electronically to [facility].

## 2017-07-04 ENCOUNTER — Other Ambulatory Visit: Payer: Self-pay | Admitting: Family Medicine

## 2017-07-22 MED FILL — ALLOPURINOL 300 MG TABLET: 300 | 90 days supply | Qty: 90 | Fill #0

## 2017-08-14 ENCOUNTER — Emergency Department
Admission: EM | Admit: 2017-08-14 | Discharge: 2017-08-14 | Disposition: A | Discharge status: Home / Self Care | Payer: 59 | Attending: Student in an Organized Health Care Education/Training Program | Admitting: Student in an Organized Health Care Education/Training Program

## 2017-08-14 ENCOUNTER — Other Ambulatory Visit: Payer: Self-pay

## 2017-08-14 ENCOUNTER — Encounter: Payer: Self-pay | Admitting: Emergency Medicine

## 2017-08-14 DIAGNOSIS — I10 Essential (primary) hypertension: Secondary | ICD-10-CM | POA: Insufficient documentation

## 2017-08-14 MED ORDER — HYDROCHLOROTHIAZIDE 25 MG PO TABS: 25.0000 mg | ORAL_TABLET | Freq: Every day | ORAL | 0 refills | Status: DC

## 2017-08-14 NOTE — Discharge Instructions (Signed)
Call your doctor's office on Monday to make an appointment to be seen sooner. Keep your appointment for your physical.Begin taking hydrochlorothiazide 25 mg one daily. Keep a daily record of your blood pressure to take to your  doctor's office.

## 2017-08-14 NOTE — ED Notes (Signed)
D/w Dr. Kerman Passey, no additional orders at this time. Pt is asymptomatic at this time for HBP, denies any pain, weakness and is ambulatory without difficulty.  Pt has no prior hx of being on medications for high blood pressure and has hx of being borderline.

## 2017-08-14 NOTE — ED Provider Notes (Signed)
[facility] Emergency Department Provider Note  ____________________________________________   First MD Initiated Contact with Patient 08/14/17 1009     (approximate)  I have reviewed the triage vital signs and the nursing notes.   HISTORY  Chief Complaint Hypertension   HPI Timothy Landry is a 56 y.o. male is here with complaint of elevated blood pressure.  Patient states that during his annual physical with his doctor told him "keep a check on it". Patient has positive history in his family for hypertension. He was not placed on medication at that time. He states that yesterday he had a headache which prompted him to check his blood pressure. He denies any vision changes, nausea, vomiting, chest pain,headache,or difficulty with speech or walking. Patient took his blood pressure using a risk of which measured 168/116. This morning he woke and was still elevated and was concerned. Currently he denies any pain.   Past Medical History:  Diagnosis Date  . BPH (benign prostatic hypertrophy) 2016   s/p photovaporization Yves Dill)  . Diverticulosis 04/2012  . GERD (gastroesophageal reflux disease)   . Gout 08/1996  . History of hiatal hernia 08/1996   Barium Swallow HH, Mod . Size  . Hyperlipidemia 12/1998  . Nephrolithiasis 2014   stent placed, then removed  . Self-catheterizes urinary bladder    2-3 times/day    Patient Active Problem List   Diagnosis Date Noted  . Elevated blood-pressure reading without diagnosis of hypertension 08/26/2016  . Benign prostatic hyperplasia   . PVC (premature ventricular contraction) 05/31/2012  . Healthcare maintenance 03/03/2012  . HLD (hyperlipidemia) 08/28/2007  . Gout 08/28/2007  . ECZEMA 08/28/2007  . HIATAL HERNIA WITH REFLUX 08/23/2007    Past Surgical History:  Procedure Laterality Date  . COLONOSCOPY  04/2012   mod diverticulosis, 3 polyps - benign, rec rpt 10 yrs (Pyrt[facility])  . CYSTECTOMY  1994   cystectomy right foot and gluteal area  . CYSTOSCOPY WITH LITHOLAPAXY  06/03/2015   Procedure: CYSTOSCOPY WITH LITHOLAPAXY WITH HOLMIUM LASER ;  Surgeon: Royston Cowper, MD;  Location: [facility] ORS;  Service: Urology;;  . Nyoka Cowden LIGHT LASER TURP (TRANSURETHRAL RESECTION OF PROSTATE N/A 06/03/2015   Procedure: GREEN LIGHT LASER TURP (TRANSURETHRAL RESECTION OF PROSTATE;  Surgeon: Royston Cowper, MD;  Location: [facility] ORS;  Service: Urology;  Laterality: N/A;  . LITHOTRIPSY  1999  . LITHOTRIPSY  2014   with stent placement, s/p removal Yves Dill)  . stress echocardiogram  05/2015   overall normal after max exercise, severe hypertensive response, EF 60-65%  . WRIST GANGLION EXCISION      Prior to Admission medications   Medication Sig Start Date End Date Taking? Authorizing Provider  allopurinol (ZYLOPRIM) 300 MG tablet TAKE 1 TABLET BY MOUTH ONCE DAILY 07/04/17   Ria Bush, MD  hydrochlorothiazide (HYDRODIURIL) 25 MG tablet Take 1 tablet (25 mg total) by mouth daily. 08/14/17   Johnn Hai, PA-C  omeprazole (PRILOSEC OTC) 20 MG tablet Take 20 mg by mouth every morning.     [provider]    Allergies Patient has no known allergies.  Family History  Problem Relation Age of Onset  . Hypertension Mother   . Diabetes Father   . Hypertension Father   . Kidney disease Father   . Hypertension Sister   . CAD Paternal Grandfather 38       MI  . Cancer Neg Hx   . Stroke Neg Hx     Social History Social  History   Tobacco Use  . Smoking status: Never Smoker  . Smokeless tobacco: Current User    Types: Chew  . Tobacco comment: 1 can/day  Substance Use Topics  . Alcohol use: Yes    Comment: rare beer  . Drug use: No    Review of Systems Constitutional: No fever/chills Eyes: No visual changes. ENT: no complaints Cardiovascular: Denies chest pain. Respiratory: Denies shortness of breath. Gastrointestinal: No abdominal pain.  No nausea, no vomiting.    Musculoskeletal: negative for body aches. Neurological: positive for headache yesterday. No focal weakness or numbness. ____________________________________________   PHYSICAL EXAM:  VITAL SIGNS: ED Triage Vitals  Enc Vitals Group     BP 08/14/17 0939 (!) 176/121     Pulse Rate 08/14/17 0939 69     Resp 08/14/17 0939 16     Temp 08/14/17 0939 98.3 F (36.8 C)     Temp Source 08/14/17 0939 Oral     SpO2 08/14/17 0939 97 %     Weight 08/14/17 0941 210 lb (95.3 kg)     Height 08/14/17 0941 5\' 9"  (1.753 m)     Head Circumference --      Peak Flow --      Pain Score 08/14/17 0941 0     Pain Loc --      Pain Edu? --      Excl. in [place]? --    Constitutional: Alert and oriented. Well appearing and in no acute distress. Eyes: Conjunctivae are normal. PERRL. EOMI. Head: Atraumatic. Nose: No congestion/rhinnorhea. Neck: No stridor.   Cardiovascular: Normal rate, regular rhythm. Grossly normal heart sounds.  Good peripheral circulation. Respiratory: Normal respiratory effort.  No retractions. Lungs CTAB. Musculoskeletal: moves upper and lower extremities without any difficulty. Normal gait was noted. Neurologic:  Normal speech and language. No gross focal neurologic deficits are appreciated. No gait instability. Skin:  Skin is warm, dry and intact. No rash noted. Psychiatric: Mood and affect are normal. Speech and behavior are normal.  ____________________________________________   LABS (all labs ordered are listed, but only abnormal results are displayed)  Labs Reviewed - No data to display   PROCEDURES  Procedure(s) performed: None  Procedures  Critical Care performed: No  ____________________________________________   INITIAL IMPRESSION / ASSESSMENT AND PLAN / ED COURSE Patient has an appointment with his PCP for a physical in January. We discussed calling on Monday to get an earlier appointment. He will also start walking his blood pressure readings for his  appointment. Patient was started on hydrochlorothiazide 25 mg one daily. He is aware that this most likely is not going to be the medication he will take once he sees his  PCP.  ____________________________________________   FINAL CLINICAL IMPRESSION(S) / ED DIAGNOSES  Final diagnoses:  Essential hypertension     ED Discharge Orders        Ordered    hydrochlorothiazide (HYDRODIURIL) 25 MG tablet  Daily     08/14/17 1046       Note:  This document was prepared using Dragon voice recognition software and may include unintentional dictation errors.    Johnn Hai, PA-C 08/14/17 1200    Merlyn Lot, MD 08/14/17 [phone]

## 2017-08-14 NOTE — ED Triage Notes (Addendum)
Pt arrived via POV from home, BP at home using wrist cuff was 168/116, pt is not on BP meds.  Pt states yesterday he had a headache which prompted to check BP. Denies any vision changes.  Pt states his PCP last year told him to keep a check because he was borderline high.  Pt denies any chest pain or shortness of breath.

## 2017-08-15 ENCOUNTER — Telehealth: Payer: Self-pay | Admitting: Family Medicine

## 2017-08-15 NOTE — Telephone Encounter (Signed)
Called pt. Back. States he feels better and will keep a log of BP's. States he had been taking decongestants - instructed pt. To take [place] and ask his pharmacist what else he can safely take. Has an appointment to see provider this month. Will call back if BP is up or if he develops headache or other symptoms.

## 2017-08-15 NOTE — Telephone Encounter (Signed)
Copied from [place] [id number]. Topic: Quick Communication - See Telephone Encounter >> Aug 15, 2017  8:19 AM Oneta Rack wrote: CRM for notification. See Telephone encounter for:   08/15/17. Relation to pt: self Call back number: Pharmacy:  Reason for call:  Patient was seen in the ED 08/14/17 due to BP concerns, patient was prescribed hydrochlorothiazide (HYDRODIURIL) 25 MG tablet took he's 1st pill last and night and second pill this morning.  Patient BP reading at 7:55am 175/107 and 8:20am 169/105, patient states he feels fine but would like to discuss BP concerns and head ache. Please advise

## 2017-09-05 ENCOUNTER — Encounter: Payer: Self-pay | Admitting: Family Medicine

## 2017-09-05 ENCOUNTER — Ambulatory Visit (INDEPENDENT_AMBULATORY_CARE_PROVIDER_SITE_OTHER): Payer: 59 | Admitting: Family Medicine

## 2017-09-05 VITALS — BP 118/80 | HR 68 | Temp 98.2°F | Ht 68.0 in | Wt 212.2 lb

## 2017-09-05 DIAGNOSIS — E785 Hyperlipidemia, unspecified: Secondary | ICD-10-CM

## 2017-09-05 DIAGNOSIS — N400 Enlarged prostate without lower urinary tract symptoms: Secondary | ICD-10-CM

## 2017-09-05 DIAGNOSIS — M1A09X0 Idiopathic chronic gout, multiple sites, without tophus (tophi): Secondary | ICD-10-CM | POA: Diagnosis not present

## 2017-09-05 DIAGNOSIS — Z0000 Encounter for general adult medical examination without abnormal findings: Secondary | ICD-10-CM | POA: Diagnosis not present

## 2017-09-05 DIAGNOSIS — I10 Essential (primary) hypertension: Secondary | ICD-10-CM | POA: Diagnosis not present

## 2017-09-05 MED ORDER — ALLOPURINOL 300 MG PO TABS: 300.0000 mg | ORAL_TABLET | Freq: Every day | ORAL | 3 refills | Status: DC

## 2017-09-05 MED ORDER — AMLODIPINE BESYLATE 2.5 MG PO TABS: 2.5000 mg | ORAL_TABLET | Freq: Every day | ORAL | 3 refills | Status: DC

## 2017-09-05 MED FILL — AMLODIPINE BESYLATE 2.5 MG: 2.5 | 90 days supply | Qty: 90 | Fill #0

## 2017-09-05 NOTE — Assessment & Plan Note (Signed)
Chronic, stable. Continue allopurinol. Consider decreased dose if UA remains low.

## 2017-09-05 NOTE — Assessment & Plan Note (Signed)
Preventative protocols reviewed and updated unless pt declined. Discussed healthy diet and lifestyle.  

## 2017-09-05 NOTE — Assessment & Plan Note (Signed)
Recent dx in setting of caffeine and decongestant use. Will stop diuretic in h/o gout, start low dose amlodipine - discussed side effects to monitor. Discussed may try off if bp remains stable now that he has backed off decongestants and caffeine.

## 2017-09-05 NOTE — Assessment & Plan Note (Signed)
Update FLP off medication.  

## 2017-09-05 NOTE — Assessment & Plan Note (Signed)
Followed by urology (Dr Wolff).  ?

## 2017-09-05 NOTE — Progress Notes (Signed)
BP 118/80 (BP Location: Left Arm, Patient Position: Sitting, Cuff Size: Normal)   Pulse 68   Temp 98.2 F (36.8 C) (Oral)   Ht 5\' 8"  (1.727 m)   Wt 212 lb 4 oz (96.3 kg)   SpO2 96%   BMI 32.27 kg/m    CC: CPE Subjective:    Patient ID: Timothy Landry, male    DOB: 1960/12/19, 57 y.o.   MRN: [id number]  HPI: Timothy Landry is a 57 y.o. male presenting on 09/05/2017 for Annual Exam (Wants to discuss if needs to continue HCTZ, started by the hospital. Provided recent BP readings)   Wife diagnosed with metastatic melanoma last year.   Some fluctuating blood pressures recently - brings log over last month ranging 90-150/70-100s. At that time he was taking caffeine and decongestants - has backed off both of these. Over the last 2 weeks has had lower readings more frequently. Endorses orthostatic dizziness now.  Fasting today. Gout - no recent flare.   Preventative: COLONOSCOPY 04/2012 mod diverticulosis, 3 polyps - benign, rec rpt 10 yrs ([facility]) Prostate screening - s/p green laser TURP by Dr Yves Dill - sees regularly. Requests PSA today.  Flu shot yearly  Td 1999, 2009  Seat belt use discussed.  Sunscreen use dicussed. No changing moles on skin.  Non smoker. Chewing tobacco.  Alcohol - none  Caffeine: 1 mountain dew/day  Lives with wife, mother in law, 1 dog - [employer] Occupation: works at [employer]  Activity: no regular exercise  Diet: some water, vegetables  Relevant past medical, surgical, family and social history reviewed and updated as indicated. Interim medical history since our last visit reviewed. Allergies and medications reviewed and updated. Outpatient Medications Prior to Visit  Medication Sig Dispense Refill  . omeprazole (PRILOSEC OTC) 20 MG tablet Take 20 mg by mouth every morning.     Marland Kitchen allopurinol (ZYLOPRIM) 300 MG tablet TAKE 1 TABLET BY MOUTH ONCE DAILY 90 tablet 0  . hydrochlorothiazide (HYDRODIURIL) 25 MG tablet Take 1 tablet (25 mg total) by  mouth daily. 30 tablet 0   No facility-administered medications prior to visit.      Per HPI unless specifically indicated in ROS section below Review of Systems  Constitutional: Negative for activity change, appetite change, chills, fatigue, fever and unexpected weight change.  HENT: Negative for hearing loss.   Eyes: Negative for visual disturbance.  Respiratory: Negative for cough, chest tightness, shortness of breath and wheezing.   Cardiovascular: Negative for chest pain, palpitations and leg swelling.  Gastrointestinal: Negative for abdominal distention, abdominal pain, blood in stool, constipation, diarrhea, nausea and vomiting.  Genitourinary: Negative for difficulty urinating and hematuria.  Musculoskeletal: Negative for arthralgias, myalgias and neck pain.  Skin: Negative for rash.  Neurological: Positive for dizziness (orthostatic). Negative for seizures, syncope and headaches.  Hematological: Negative for adenopathy. Does not bruise/bleed easily.  Psychiatric/Behavioral: Negative for dysphoric mood. The patient is not nervous/anxious.        Objective:    BP 118/80 (BP Location: Left Arm, Patient Position: Sitting, Cuff Size: Normal)   Pulse 68   Temp 98.2 F (36.8 C) (Oral)   Ht 5\' 8"  (1.727 m)   Wt 212 lb 4 oz (96.3 kg)   SpO2 96%   BMI 32.27 kg/m   Wt Readings from Last 3 Encounters:  09/05/17 212 lb 4 oz (96.3 kg)  08/14/17 210 lb (95.3 kg)  08/26/16 212 lb (96.2 kg)    Physical Exam  Constitutional:  He is oriented to person, place, and time. He appears well-developed and well-nourished. No distress.  HENT:  Head: Normocephalic and atraumatic.  Right Ear: Hearing, tympanic membrane, external ear and ear canal normal.  Left Ear: Hearing, tympanic membrane, external ear and ear canal normal.  Nose: Nose normal.  Mouth/Throat: Uvula is midline, oropharynx is clear and moist and mucous membranes are normal. No oropharyngeal exudate, posterior oropharyngeal  edema or posterior oropharyngeal erythema.  Eyes: Conjunctivae and EOM are normal. Pupils are equal, round, and reactive to light. No scleral icterus.  Neck: Normal range of motion. Neck supple. No thyromegaly present.  Cardiovascular: Normal rate, regular rhythm, normal heart sounds and intact distal pulses.  No murmur heard. Pulses:      Radial pulses are 2+ on the right side, and 2+ on the left side.  Pulmonary/Chest: Effort normal and breath sounds normal. No respiratory distress. He has no wheezes. He has no rales.  Abdominal: Soft. Bowel sounds are normal. He exhibits no distension and no mass. There is no tenderness. There is no rebound and no guarding.  Musculoskeletal: Normal range of motion. He exhibits no edema.  Lymphadenopathy:    He has no cervical adenopathy.  Neurological: He is alert and oriented to person, place, and time.  CN grossly intact, station and gait intact  Skin: Skin is warm and dry. No rash noted.  Psychiatric: He has a normal mood and affect. His behavior is normal. Judgment and thought content normal.  Nursing note and vitals reviewed.  Results for orders placed or performed in visit on 08/26/16  Lipid panel  Result Value Ref Range   Cholesterol 182 0 - 200 mg/dL   Triglycerides 113.0 0.0 - 149.0 mg/dL   HDL 43.40 >39.00 mg/dL   VLDL 22.6 0.0 - 40.0 mg/dL   LDL Cholesterol 116 (H) 0 - 99 mg/dL   Total CHOL/HDL Ratio 4    NonHDL 138.43   Comprehensive metabolic panel  Result Value Ref Range   Sodium 144 135 - 145 mEq/L   Potassium 4.4 3.5 - 5.1 mEq/L   Chloride 106 96 - 112 mEq/L   CO2 31 19 - 32 mEq/L   Glucose, Bld 89 70 - 99 mg/dL   BUN 14 6 - 23 mg/dL   Creatinine, Ser 0.90 0.40 - 1.50 mg/dL   Total Bilirubin 1.3 (H) 0.2 - 1.2 mg/dL   Alkaline Phosphatase 58 39 - 117 U/L   AST 16 0 - 37 U/L   ALT 19 0 - 53 U/L   Total Protein 6.7 6.0 - 8.3 g/dL   Albumin 4.2 3.5 - 5.2 g/dL   Calcium 9.4 8.4 - 10.5 mg/dL   GFR 92.53 >60.00 mL/min  TSH    Result Value Ref Range   TSH 0.56 0.35 - 4.50 uIU/mL  PSA  Result Value Ref Range   PSA 0.36 0.10 - 4.00 ng/mL  Hepatitis C antibody  Result Value Ref Range   HCV Ab NEGATIVE NEGATIVE  Uric acid  Result Value Ref Range   Uric Acid, Serum 4.6 4.0 - 7.8 mg/dL      Assessment & Plan:   Problem List Items Addressed This Visit    Benign prostatic hyperplasia    Followed by urology Dr Yves Dill      Relevant Orders   PSA   Gout    Chronic, stable. Continue allopurinol. Consider decreased dose if UA remains low.       Relevant Orders   Uric acid  Healthcare maintenance - Primary    Preventative protocols reviewed and updated unless pt declined. Discussed healthy diet and lifestyle.       HLD (hyperlipidemia)    Update FLP off medication.       Relevant Medications   amLODipine (NORVASC) 2.5 MG tablet   Other Relevant Orders   Lipid panel   Comprehensive metabolic panel   HTN (hypertension)    Recent dx in setting of caffeine and decongestant use. Will stop diuretic in h/o gout, start low dose amlodipine - discussed side effects to monitor. Discussed may try off if bp remains stable now that he has backed off decongestants and caffeine.      Relevant Medications   amLODipine (NORVASC) 2.5 MG tablet       Follow up plan: Return in about 1 year (around 09/05/2018) for annual exam, prior fasting for blood work.  Ria Bush, MD

## 2017-09-05 NOTE — Patient Instructions (Addendum)
You are doing well today Labs today Stop hydrochlorothiazide and start amlodipine 2.5mg  daily - sent to pharmacy.  Return as needed or in 1 year for next physical   Health Maintenance, Male A healthy lifestyle and preventive care is important for your health and wellness. Ask your health care provider about what schedule of regular examinations is right for you. What should I know about weight and diet? Eat a Healthy Diet  Eat plenty of vegetables, fruits, whole grains, low-fat dairy products, and lean protein.  Do not eat a lot of foods high in solid fats, added sugars, or salt.  Maintain a Healthy Weight Regular exercise can help you achieve or maintain a healthy weight. You should:  Do at least 150 minutes of exercise each week. The exercise should increase your heart rate and make you sweat (moderate-intensity exercise).  Do strength-training exercises at least twice a week.  Watch Your Levels of Cholesterol and Blood Lipids  Have your blood tested for lipids and cholesterol every 5 years starting at 58 years of age. If you are at high risk for heart disease, you should start having your blood tested when you are 58 years old. You may need to have your cholesterol levels checked more often if: ? Your lipid or cholesterol levels are high. ? You are older than 58 years of age. ? You are at high risk for heart disease.  What should I know about cancer screening? Many types of cancers can be detected early and may often be prevented. Lung Cancer  You should be screened every year for lung cancer if: ? You are a current smoker who has smoked for at least 30 years. ? You are a former smoker who has quit within the past 15 years.  Talk to your health care provider about your screening options, when you should start screening, and how often you should be screened.  Colorectal Cancer  Routine colorectal cancer screening usually begins at 58 years of age and should be repeated every  5-10 years until you are 58 years old. You may need to be screened more often if early forms of precancerous polyps or small growths are found. Your health care provider may recommend screening at an earlier age if you have risk factors for colon cancer.  Your health care provider may recommend using home test kits to check for hidden blood in the stool.  A small camera at the end of a tube can be used to examine your colon (sigmoidoscopy or colonoscopy). This checks for the earliest forms of colorectal cancer.  Prostate and Testicular Cancer  Depending on your age and overall health, your health care provider may do certain tests to screen for prostate and testicular cancer.  Talk to your health care provider about any symptoms or concerns you have about testicular or prostate cancer.  Skin Cancer  Check your skin from head to toe regularly.  Tell your health care provider about any new moles or changes in moles, especially if: ? There is a change in a mole's size, shape, or color. ? You have a mole that is larger than a pencil eraser.  Always use sunscreen. Apply sunscreen liberally and repeat throughout the day.  Protect yourself by wearing long sleeves, pants, a wide-brimmed hat, and sunglasses when outside.  What should I know about heart disease, diabetes, and high blood pressure?  If you are 52-38 years of age, have your blood pressure checked every 3-5 years. If you are 40  years of age or older, have your blood pressure checked every year. You should have your blood pressure measured twice-once when you are at a hospital or clinic, and once when you are not at a hospital or clinic. Record the average of the two measurements. To check your blood pressure when you are not at a hospital or clinic, you can use: ? An automated blood pressure machine at a pharmacy. ? A home blood pressure monitor.  Talk to your health care provider about your target blood pressure.  If you are  between 43-55 years old, ask your health care provider if you should take aspirin to prevent heart disease.  Have regular diabetes screenings by checking your fasting blood sugar level. ? If you are at a normal weight and have a low risk for diabetes, have this test once every three years after the age of 73. ? If you are overweight and have a high risk for diabetes, consider being tested at a younger age or more often.  A one-time screening for abdominal aortic aneurysm (AAA) by ultrasound is recommended for men aged 74-75 years who are current or former smokers. What should I know about preventing infection? Hepatitis B If you have a higher risk for hepatitis B, you should be screened for this virus. Talk with your health care provider to find out if you are at risk for hepatitis B infection. Hepatitis C Blood testing is recommended for:  Everyone born from [birth year] through 1965.  Anyone with known risk factors for hepatitis C.  Sexually Transmitted Diseases (STDs)  You should be screened each year for STDs including gonorrhea and chlamydia if: ? You are sexually active and are younger than 58 years of age. ? You are older than 58 years of age and your health care provider tells you that you are at risk for this type of infection. ? Your sexual activity has changed since you were last screened and you are at an increased risk for chlamydia or gonorrhea. Ask your health care provider if you are at risk.  Talk with your health care provider about whether you are at high risk of being infected with HIV. Your health care provider may recommend a prescription medicine to help prevent HIV infection.  What else can I do?  Schedule regular health, dental, and eye exams.  Stay current with your vaccines (immunizations).  Do not use any tobacco products, such as cigarettes, chewing tobacco, and e-cigarettes. If you need help quitting, ask your health care provider.  Limit alcohol intake to no  more than 2 drinks per day. One drink equals 12 ounces of beer, 5 ounces of wine, or 1 ounces of hard liquor.  Do not use street drugs.  Do not share needles.  Ask your health care provider for help if you need support or information about quitting drugs.  Tell your health care provider if you often feel depressed.  Tell your health care provider if you have ever been abused or do not feel safe at home. This information is not intended to replace advice given to you by your health care provider. Make sure you discuss any questions you have with your health care provider. Document Released: 01/29/2008 Document Revised: 03/31/2016 Document Reviewed: 05/06/2015 Elsevier Interactive Patient Education  [employer].

## 2017-09-06 LAB — COMPREHENSIVE METABOLIC PANEL
ALT: 19 U/L (ref 0–53)
AST: 18 U/L (ref 0–37)
Albumin: 4.3 g/dL (ref 3.5–5.2)
Alkaline Phosphatase: 58 U/L (ref 39–117)
BUN: 21 mg/dL (ref 6–23)
CALCIUM: 9.4 mg/dL (ref 8.4–10.5)
CHLORIDE: 100 meq/L (ref 96–112)
CO2: 34 mEq/L — ABNORMAL HIGH (ref 19–32)
Creatinine, Ser: 0.91 mg/dL (ref 0.40–1.50)
GFR: 91.03 mL/min (ref >60.00)
Glucose, Bld: 78 mg/dL (ref 70–99)
POTASSIUM: 4.1 meq/L (ref 3.5–5.1)
Sodium: 141 mEq/L (ref 135–145)
Total Bilirubin: 0.5 mg/dL (ref 0.2–1.2)
Total Protein: 6.5 g/dL (ref 6.0–8.3)

## 2017-09-06 LAB — URIC ACID: URIC ACID, SERUM: 4.5 mg/dL (ref 4.0–7.8)

## 2017-09-06 LAB — LIPID PANEL
CHOLESTEROL: 160 mg/dL (ref 0–200)
HDL: 44.1 mg/dL (ref >39.00)
LDL CALC: 89 mg/dL (ref 0–99)
NonHDL: 116.24
TRIGLYCERIDES: 137 mg/dL (ref 0.0–149.0)
Total CHOL/HDL Ratio: 4
VLDL: 27.4 mg/dL (ref 0.0–40.0)

## 2017-09-06 LAB — PSA: PSA: 0.32 ng/mL (ref 0.10–4.00)

## 2017-11-02 MED FILL — ALLOPURINOL 300 MG TABLET: 300 | 90 days supply | Qty: 90 | Fill #0

## 2017-11-25 MED FILL — AMLODIPINE BESYLATE 2.5 MG: 2.5 | 90 days supply | Qty: 90 | Fill #1

## 2018-02-03 MED FILL — ALLOPURINOL 300 MG TABLET: 300 | 90 days supply | Qty: 90 | Fill #1

## 2018-03-03 MED FILL — AMLODIPINE BESYLATE 2.5 MG: 2.5 | 90 days supply | Qty: 90 | Fill #2

## 2018-05-02 MED FILL — ALLOPURINOL 300 MG TABS: 300 | 90 days supply | Qty: 90 | Fill #2

## 2018-05-31 MED FILL — AMLODIPINE 2.5 MG TABLET: 2.5 | 90 days supply | Qty: 90 | Fill #3

## 2018-07-31 MED FILL — ALLOPURINOL 300 MG TABS: 300 | 90 days supply | Qty: 90 | Fill #3

## 2018-08-31 ENCOUNTER — Other Ambulatory Visit: Payer: Self-pay | Admitting: Family Medicine

## 2018-08-31 DIAGNOSIS — M1A09X0 Idiopathic chronic gout, multiple sites, without tophus (tophi): Secondary | ICD-10-CM

## 2018-08-31 DIAGNOSIS — I10 Essential (primary) hypertension: Secondary | ICD-10-CM

## 2018-08-31 DIAGNOSIS — E785 Hyperlipidemia, unspecified: Secondary | ICD-10-CM

## 2018-08-31 DIAGNOSIS — N400 Enlarged prostate without lower urinary tract symptoms: Secondary | ICD-10-CM

## 2018-09-01 ENCOUNTER — Other Ambulatory Visit (INDEPENDENT_AMBULATORY_CARE_PROVIDER_SITE_OTHER): Payer: 59

## 2018-09-01 DIAGNOSIS — M1A09X0 Idiopathic chronic gout, multiple sites, without tophus (tophi): Secondary | ICD-10-CM | POA: Diagnosis not present

## 2018-09-01 DIAGNOSIS — N400 Enlarged prostate without lower urinary tract symptoms: Secondary | ICD-10-CM | POA: Diagnosis not present

## 2018-09-01 DIAGNOSIS — E785 Hyperlipidemia, unspecified: Secondary | ICD-10-CM

## 2018-09-01 DIAGNOSIS — I10 Essential (primary) hypertension: Secondary | ICD-10-CM | POA: Diagnosis not present

## 2018-09-01 LAB — LIPID PANEL
CHOL/HDL RATIO: 3
Cholesterol: 168 mg/dL (ref 0–200)
HDL: 50.9 mg/dL (ref >39.00)
LDL CALC: 100 mg/dL — AB (ref 0–99)
NonHDL: 116.95
Triglycerides: 84 mg/dL (ref 0.0–149.0)
VLDL: 16.8 mg/dL (ref 0.0–40.0)

## 2018-09-01 LAB — COMPREHENSIVE METABOLIC PANEL
ALT: 11 U/L (ref 0–53)
AST: 13 U/L (ref 0–37)
Albumin: 4.3 g/dL (ref 3.5–5.2)
Alkaline Phosphatase: 54 U/L (ref 39–117)
BUN: 18 mg/dL (ref 6–23)
CO2: 29 meq/L (ref 19–32)
CREATININE: 1.05 mg/dL (ref 0.40–1.50)
Calcium: 9.5 mg/dL (ref 8.4–10.5)
Chloride: 106 mEq/L (ref 96–112)
GFR: 72.36 mL/min (ref >60.00)
GLUCOSE: 101 mg/dL — AB (ref 70–99)
Potassium: 4.4 mEq/L (ref 3.5–5.1)
Sodium: 143 mEq/L (ref 135–145)
TOTAL PROTEIN: 6.7 g/dL (ref 6.0–8.3)
Total Bilirubin: 1 mg/dL (ref 0.2–1.2)

## 2018-09-01 LAB — PSA: PSA: 0.31 ng/mL (ref 0.10–4.00)

## 2018-09-01 LAB — MICROALBUMIN / CREATININE URINE RATIO
Creatinine,U: 158.8 mg/dL
Microalb Creat Ratio: 0.5 mg/g (ref 0.0–30.0)
Microalb, Ur: 0.8 mg/dL (ref 0.0–1.9)

## 2018-09-01 LAB — URIC ACID: URIC ACID, SERUM: 4.3 mg/dL (ref 4.0–7.8)

## 2018-09-04 ENCOUNTER — Other Ambulatory Visit: Payer: Self-pay | Admitting: Family Medicine

## 2018-09-04 MED FILL — AMLODIPINE 2.5 MG TABLET: 2.5 | 90 days supply | Qty: 90 | Fill #0

## 2018-09-06 ENCOUNTER — Encounter: Payer: Self-pay | Admitting: Family Medicine

## 2018-09-06 ENCOUNTER — Ambulatory Visit (INDEPENDENT_AMBULATORY_CARE_PROVIDER_SITE_OTHER): Payer: 59 | Admitting: Family Medicine

## 2018-09-06 VITALS — BP 118/90 | HR 71 | Temp 97.9°F | Ht 67.75 in | Wt 205.0 lb

## 2018-09-06 DIAGNOSIS — I10 Essential (primary) hypertension: Secondary | ICD-10-CM | POA: Diagnosis not present

## 2018-09-06 DIAGNOSIS — K449 Diaphragmatic hernia without obstruction or gangrene: Secondary | ICD-10-CM | POA: Diagnosis not present

## 2018-09-06 DIAGNOSIS — Z23 Encounter for immunization: Secondary | ICD-10-CM

## 2018-09-06 DIAGNOSIS — Z7729 Contact with and (suspected ) exposure to other hazardous substances: Secondary | ICD-10-CM | POA: Diagnosis not present

## 2018-09-06 DIAGNOSIS — Z0000 Encounter for general adult medical examination without abnormal findings: Secondary | ICD-10-CM

## 2018-09-06 DIAGNOSIS — M1A09X0 Idiopathic chronic gout, multiple sites, without tophus (tophi): Secondary | ICD-10-CM | POA: Diagnosis not present

## 2018-09-06 DIAGNOSIS — K219 Gastro-esophageal reflux disease without esophagitis: Secondary | ICD-10-CM | POA: Diagnosis not present

## 2018-09-06 DIAGNOSIS — N400 Enlarged prostate without lower urinary tract symptoms: Secondary | ICD-10-CM

## 2018-09-06 DIAGNOSIS — E785 Hyperlipidemia, unspecified: Secondary | ICD-10-CM

## 2018-09-06 MED ORDER — ALLOPURINOL 300 MG PO TABS: 300.0000 mg | ORAL_TABLET | Freq: Every day | ORAL | 4 refills | Status: DC

## 2018-09-06 MED ORDER — ALLOPURINOL 300 MG PO TABS: 150.0000 mg | ORAL_TABLET | Freq: Every day | ORAL | 4 refills | Status: DC

## 2018-09-06 MED ORDER — AMLODIPINE BESYLATE 2.5 MG PO TABS: 2.5000 mg | ORAL_TABLET | Freq: Every day | ORAL | 4 refills | Status: DC

## 2018-09-06 NOTE — Assessment & Plan Note (Signed)
25 yrs working at [employer]. Receives yearly chest xray through work

## 2018-09-06 NOTE — Assessment & Plan Note (Signed)
Chronic, stable off medication. Continue to monitor.  The 10-year ASCVD risk score Mikey Bussing [name]., et al., 2013) is: 6.1%   Values used to calculate the score:     Age: 58 years     Sex: Male     Is Non-Hispanic African American: No     Diabetic: No     Tobacco smoker: No     Systolic Blood Pressure: 013 mmHg     Is BP treated: Yes     HDL Cholesterol: 50.9 mg/dL     Total Cholesterol: 168 mg/dL

## 2018-09-06 NOTE — Assessment & Plan Note (Signed)
Chronic. Great control on allopurinol 300mg  daily. Will trial 150mg  dose.

## 2018-09-06 NOTE — Assessment & Plan Note (Signed)
Preventative protocols reviewed and updated unless pt declined. Discussed healthy diet and lifestyle.  

## 2018-09-06 NOTE — Patient Instructions (Addendum)
Tdap today (Tetanus and whooping cough).  If interested, check with pharmacy about new 2 shot shingles series (shingrix).  Labs were looking good - try 1/2 tablet allopurinol daily in place of whole (150mg  daily) for gout control.  You are doing well today.  Return as needed or in 1 year for next physical.  Health Maintenance, Male A healthy lifestyle and preventive care is important for your health and wellness. Ask your health care provider about what schedule of regular examinations is right for you. What should I know about weight and diet? Eat a Healthy Diet  Eat plenty of vegetables, fruits, whole grains, low-fat dairy products, and lean protein.  Do not eat a lot of foods high in solid fats, added sugars, or salt.  Maintain a Healthy Weight Regular exercise can help you achieve or maintain a healthy weight. You should:  Do at least 150 minutes of exercise each week. The exercise should increase your heart rate and make you sweat (moderate-intensity exercise).  Do strength-training exercises at least twice a week. Watch Your Levels of Cholesterol and Blood Lipids  Have your blood tested for lipids and cholesterol every 5 years starting at 59 years of age. If you are at high risk for heart disease, you should start having your blood tested when you are 59 years old. You may need to have your cholesterol levels checked more often if: ? Your lipid or cholesterol levels are high. ? You are older than 59 years of age. ? You are at high risk for heart disease. What should I know about cancer screening? Many types of cancers can be detected early and may often be prevented. Lung Cancer  You should be screened every year for lung cancer if: ? You are a current smoker who has smoked for at least 30 years. ? You are a former smoker who has quit within the past 15 years.  Talk to your health care provider about your screening options, when you should start screening, and how often you  should be screened. Colorectal Cancer  Routine colorectal cancer screening usually begins at 59 years of age and should be repeated every 5-10 years until you are 59 years old. You may need to be screened more often if early forms of precancerous polyps or small growths are found. Your health care provider may recommend screening at an earlier age if you have risk factors for colon cancer.  Your health care provider may recommend using home test kits to check for hidden blood in the stool.  A small camera at the end of a tube can be used to examine your colon (sigmoidoscopy or colonoscopy). This checks for the earliest forms of colorectal cancer. Prostate and Testicular Cancer  Depending on your age and overall health, your health care provider may do certain tests to screen for prostate and testicular cancer.  Talk to your health care provider about any symptoms or concerns you have about testicular or prostate cancer. Skin Cancer  Check your skin from head to toe regularly.  Tell your health care provider about any new moles or changes in moles, especially if: ? There is a change in a mole's size, shape, or color. ? You have a mole that is larger than a pencil eraser.  Always use sunscreen. Apply sunscreen liberally and repeat throughout the day.  Protect yourself by wearing long sleeves, pants, a wide-brimmed hat, and sunglasses when outside. What should I know about heart disease, diabetes, and high blood pressure?  If you are 60-38 years of age, have your blood pressure checked every 3-5 years. If you are 9 years of age or older, have your blood pressure checked every year. You should have your blood pressure measured twice-once when you are at a hospital or clinic, and once when you are not at a hospital or clinic. Record the average of the two measurements. To check your blood pressure when you are not at a hospital or clinic, you can use: ? An automated blood pressure machine at a  pharmacy. ? A home blood pressure monitor.  Talk to your health care provider about your target blood pressure.  If you are between 52-99 years old, ask your health care provider if you should take aspirin to prevent heart disease.  Have regular diabetes screenings by checking your fasting blood sugar level. ? If you are at a normal weight and have a low risk for diabetes, have this test once every three years after the age of 26. ? If you are overweight and have a high risk for diabetes, consider being tested at a younger age or more often.  A one-time screening for abdominal aortic aneurysm (AAA) by ultrasound is recommended for men aged 65-75 years who are current or former smokers. What should I know about preventing infection? Hepatitis B If you have a higher risk for hepatitis B, you should be screened for this virus. Talk with your health care provider to find out if you are at risk for hepatitis B infection. Hepatitis C Blood testing is recommended for:  Everyone born from [birth year] through 1965.  Anyone with known risk factors for hepatitis C. Sexually Transmitted Diseases (STDs)  You should be screened each year for STDs including gonorrhea and chlamydia if: ? You are sexually active and are younger than 59 years of age. ? You are older than 59 years of age and your health care provider tells you that you are at risk for this type of infection. ? Your sexual activity has changed since you were last screened and you are at an increased risk for chlamydia or gonorrhea. Ask your health care provider if you are at risk.  Talk with your health care provider about whether you are at high risk of being infected with HIV. Your health care provider may recommend a prescription medicine to help prevent HIV infection. What else can I do?  Schedule regular health, dental, and eye exams.  Stay current with your vaccines (immunizations).  Do not use any tobacco products, such as cigarettes,  chewing tobacco, and e-cigarettes. If you need help quitting, ask your health care provider.  Limit alcohol intake to no more than 2 drinks per day. One drink equals 12 ounces of beer, 5 ounces of wine, or 1 ounces of hard liquor.  Do not use street drugs.  Do not share needles.  Ask your health care provider for help if you need support or information about quitting drugs.  Tell your health care provider if you often feel depressed.  Tell your health care provider if you have ever been abused or do not feel safe at home. This information is not intended to replace advice given to you by your health care provider. Make sure you discuss any questions you have with your health care provider. Document Released: 01/29/2008 Document Revised: 03/31/2016 Document Reviewed: 05/06/2015 Elsevier Interactive Patient Education  2019 [employer].

## 2018-09-06 NOTE — Assessment & Plan Note (Signed)
Chronic, stable. Continue current regimen. 

## 2018-09-06 NOTE — Assessment & Plan Note (Addendum)
Chronic, stable on daily low dose omeprazole OTC

## 2018-09-06 NOTE — Progress Notes (Signed)
BP 118/90 (BP Location: Right Arm, Patient Position: Sitting, Cuff Size: Normal)   Pulse 71   Temp 97.9 F (36.6 C) (Oral)   Ht 5' 7.75" (1.721 m)   Wt 205 lb (93 kg)   SpO2 95%   BMI 31.40 kg/m    CC: CPE Subjective:    Patient ID: Timothy Landry, male    DOB: 02/13/1961, 58 y.o.   MRN: [id number]  HPI: Timothy Landry is a 58 y.o. male presenting on 09/06/2018 for Annual Exam   GERD - takes omeprazole 20mg  daily. Enjoys spicy foods.   7 lb weight loss noted - notes portion sizes are smaller than previously. He likes his junk food.   Works around [place] - 38 yrs of exposure. He receives chest xray once or twice a year at work.   Preventative: COLONOSCOPY 04/2012 mod diverticulosis, 3 polyps - benign, rec rpt 10 yrs (Pyrtle) Prostate screening -s/p green laser TURP by Dr Yves Dill 2016 - released from urology care. Yearly PSA at our office.  Flu shotyearly   Td 1999, 2009, Tdap today shingrix - discussed  Seat beltuse discussed. Sunscreen use dicussed.No changing moles on skin.  Never smoker. Chewing tobacco - cutting back.  Alcohol - none Dentist q6 mo Eye exam yearly  Caffeine: 1 mountain dew/day  Lives with wife, mother in law, 1dog- american bully Occupation: works at [employer]  Activity: no regular exercise, walks dog daily (30 min) Diet:somewater, vegetables. Likes junk food     Relevant past medical, surgical, family and social history reviewed and updated as indicated. Interim medical history since our last visit reviewed. Allergies and medications reviewed and updated. Outpatient Medications Prior to Visit  Medication Sig Dispense Refill  . omeprazole (PRILOSEC OTC) 20 MG tablet Take 20 mg by mouth every morning.     Marland Kitchen allopurinol (ZYLOPRIM) 300 MG tablet Take 1 tablet (300 mg total) by mouth daily. 90 tablet 3  . amLODipine (NORVASC) 2.5 MG tablet TAKE 1 TABLET (2.5 MG TOTAL) BY MOUTH DAILY. 90 tablet 0   No facility-administered medications  prior to visit.      Per HPI unless specifically indicated in ROS section below Review of Systems  Constitutional: Negative for activity change, appetite change, chills, fatigue, fever and unexpected weight change.  HENT: Positive for congestion (?allergies). Negative for hearing loss.   Eyes: Negative for visual disturbance.  Respiratory: Negative for cough, chest tightness, shortness of breath and wheezing.   Cardiovascular: Negative for chest pain, palpitations and leg swelling.  Gastrointestinal: Negative for abdominal distention, abdominal pain, blood in stool, constipation, diarrhea, nausea and vomiting.  Genitourinary: Negative for difficulty urinating and hematuria.  Musculoskeletal: Negative for arthralgias, myalgias and neck pain.  Skin: Negative for rash.  Neurological: Positive for headaches (attributed to headaches). Negative for dizziness, seizures and syncope.  Hematological: Negative for adenopathy. Does not bruise/bleed easily.  Psychiatric/Behavioral: Negative for dysphoric mood. The patient is not nervous/anxious.    Objective:    BP 118/90 (BP Location: Right Arm, Patient Position: Sitting, Cuff Size: Normal)   Pulse 71   Temp 97.9 F (36.6 C) (Oral)   Ht 5' 7.75" (1.721 m)   Wt 205 lb (93 kg)   SpO2 95%   BMI 31.40 kg/m   Wt Readings from Last 3 Encounters:  09/06/18 205 lb (93 kg)  09/05/17 212 lb 4 oz (96.3 kg)  08/14/17 210 lb (95.3 kg)    Physical Exam Vitals signs and nursing note reviewed.  Constitutional:      General: He is not in acute distress.    Appearance: Normal appearance. He is well-developed.  HENT:     Head: Normocephalic and atraumatic.     Right Ear: Hearing, tympanic membrane, ear canal and external ear normal.     Left Ear: Hearing, tympanic membrane, ear canal and external ear normal.     Nose: Nose normal.     Mouth/Throat:     Mouth: Mucous membranes are moist.     Pharynx: Oropharynx is clear. Uvula midline. No oropharyngeal  exudate or posterior oropharyngeal erythema.  Eyes:     General: No scleral icterus.    Extraocular Movements: Extraocular movements intact.     Conjunctiva/sclera: Conjunctivae normal.     Pupils: Pupils are equal, round, and reactive to light.  Neck:     Musculoskeletal: Normal range of motion and neck supple.  Cardiovascular:     Rate and Rhythm: Normal rate and regular rhythm.     Pulses:          Radial pulses are 2+ on the right side and 2+ on the left side.     Heart sounds: Normal heart sounds. No murmur.  Pulmonary:     Effort: Pulmonary effort is normal. No respiratory distress.     Breath sounds: Normal breath sounds. No wheezing or rales.  Abdominal:     General: Bowel sounds are normal. There is no distension.     Palpations: Abdomen is soft. There is no mass.     Tenderness: There is no abdominal tenderness. There is no guarding or rebound.     Hernia: No hernia is present.  Musculoskeletal: Normal range of motion.  Lymphadenopathy:     Cervical: No cervical adenopathy.  Skin:    General: Skin is warm and dry.     Capillary Refill: Capillary refill takes less than 2 seconds.     Findings: No rash.  Neurological:     Mental Status: He is alert and oriented to person, place, and time.     Comments: CN grossly intact, station and gait intact  Psychiatric:        Behavior: Behavior normal.        Thought Content: Thought content normal.        Judgment: Judgment normal.       Results for orders placed or performed in visit on 09/01/18  Microalbumin / creatinine urine ratio  Result Value Ref Range   Microalb, Ur 0.8 0.0 - 1.9 mg/dL   Creatinine,U 158.8 mg/dL   Microalb Creat Ratio 0.5 0.0 - 30.0 mg/g  PSA  Result Value Ref Range   PSA 0.31 0.10 - 4.00 ng/mL  Uric acid  Result Value Ref Range   Uric Acid, Serum 4.3 4.0 - 7.8 mg/dL  Comprehensive metabolic panel  Result Value Ref Range   Sodium 143 135 - 145 mEq/L   Potassium 4.4 3.5 - 5.1 mEq/L   Chloride  106 96 - 112 mEq/L   CO2 29 19 - 32 mEq/L   Glucose, Bld 101 (H) 70 - 99 mg/dL   BUN 18 6 - 23 mg/dL   Creatinine, Ser 1.05 0.40 - 1.50 mg/dL   Total Bilirubin 1.0 0.2 - 1.2 mg/dL   Alkaline Phosphatase 54 39 - 117 U/L   AST 13 0 - 37 U/L   ALT 11 0 - 53 U/L   Total Protein 6.7 6.0 - 8.3 g/dL   Albumin 4.3 3.5 - 5.2 g/dL  Calcium 9.5 8.4 - 10.5 mg/dL   GFR 72.36 >60.00 mL/min  Lipid panel  Result Value Ref Range   Cholesterol 168 0 - 200 mg/dL   Triglycerides 84.0 0.0 - 149.0 mg/dL   HDL 50.90 >39.00 mg/dL   VLDL 16.8 0.0 - 40.0 mg/dL   LDL Cholesterol 100 (H) 0 - 99 mg/dL   Total CHOL/HDL Ratio 3    NonHDL 116.95    Assessment & Plan:   Problem List Items Addressed This Visit    HTN (hypertension)    Chronic, stable. Continue current regimen.       Relevant Medications   amLODipine (NORVASC) 2.5 MG tablet   HLD (hyperlipidemia)    Chronic, stable off medication. Continue to monitor.  The 10-year ASCVD risk score Mikey Bussing [name]., et al., 2013) is: 6.1%   Values used to calculate the score:     Age: 59 years     Sex: Male     Is Non-Hispanic African American: No     Diabetic: No     Tobacco smoker: No     Systolic Blood Pressure: 177 mmHg     Is BP treated: Yes     HDL Cholesterol: 50.9 mg/dL     Total Cholesterol: 168 mg/dL       Relevant Medications   amLODipine (NORVASC) 2.5 MG tablet   Hiatal hernia with GERD    Chronic, stable on daily low dose omeprazole OTC      Healthcare maintenance - Primary    Preventative protocols reviewed and updated unless pt declined. Discussed healthy diet and lifestyle.       Gout    Chronic. Great control on allopurinol 300mg  daily. Will trial 150mg  dose.       Exposure to silica    38 yrs working at [employer]. Receives yearly chest xray through work       Benign prostatic hyperplasia    Chronic, stable. Released by urology. PSA remains stable.        Other Visit Diagnoses    Need for Tdap vaccination        Relevant Orders   Tdap vaccine greater than or equal to 7yo IM (Completed)       Meds ordered this encounter  Medications  . DISCONTD: allopurinol (ZYLOPRIM) 300 MG tablet    Sig: Take 1 tablet (300 mg total) by mouth daily.    Dispense:  90 tablet    Refill:  4  . amLODipine (NORVASC) 2.5 MG tablet    Sig: Take 1 tablet (2.5 mg total) by mouth daily.    Dispense:  90 tablet    Refill:  4  . allopurinol (ZYLOPRIM) 300 MG tablet    Sig: Take 0.5 tablets (150 mg total) by mouth daily.    Dispense:  45 tablet    Refill:  4    Note new sig   Orders Placed This Encounter  Procedures  . Tdap vaccine greater than or equal to 7yo IM    Follow up plan: Return in about 1 year (around 09/07/2019) for annual exam, prior fasting for blood work.  Ria Bush, MD

## 2018-09-06 NOTE — Assessment & Plan Note (Signed)
Chronic, stable. Released by urology. PSA remains stable.

## 2018-11-01 ENCOUNTER — Other Ambulatory Visit: Payer: Self-pay

## 2018-11-01 ENCOUNTER — Ambulatory Visit: Payer: 59 | Admitting: Family Medicine

## 2018-11-01 ENCOUNTER — Encounter: Payer: Self-pay | Admitting: Family Medicine

## 2018-11-01 VITALS — BP 124/80 | HR 69 | Temp 98.4°F | Ht 67.75 in | Wt 209.2 lb

## 2018-11-01 DIAGNOSIS — J019 Acute sinusitis, unspecified: Secondary | ICD-10-CM | POA: Diagnosis not present

## 2018-11-01 MED ORDER — FLUTICASONE PROPIONATE 50 MCG/ACT NA SUSP: 2.0000 | Freq: Every day | NASAL | 1 refills | Status: DC

## 2018-11-01 NOTE — Assessment & Plan Note (Signed)
Anticipate viral given short duration. Supportive care reviewed as per instructions. Update if not improving with treatment. Pt agrees with plan. Red flags to consider abx course provided - he will call us if this occurs.

## 2018-11-01 NOTE — Patient Instructions (Addendum)
You have a sinus infection, likely viral. Try flonase nasal steroid.  Push fluids and plenty of rest. Take ibuprofen 400-600mg  with meals.  Nasal saline irrigation or neti pot to help drain sinuses. May use plain mucinex with plenty of fluid to help mobilize mucous. Please let us know if fever >101.5, ongoing symptoms past 7-10 days, or worsening facial pain or worsening productive cough. These would be signs of bacterial sinus infection you would need antibiotic for.

## 2018-11-01 NOTE — Progress Notes (Signed)
BP 124/80 (BP Location: Left Arm, Patient Position: Sitting, Cuff Size: Normal)   Pulse 69   Temp 98.4 F (36.9 C) (Oral)   Ht 5' 7.75" (1.721 m)   Wt 209 lb 4 oz (94.9 kg)   SpO2 97%   BMI 32.05 kg/m    CC: cough, congestion Subjective:    Patient ID: Timothy Landry, male    DOB: 08-13-1961, 58 y.o.   MRN: [id number]  HPI: Timothy Landry is a 58 y.o. male presenting on 11/01/2018 for Cough (C/o productive cough, sinus congestion and HA. Sxs started 10/28/18. Tried Mucinex, helpful with cough but not HA. )   4d h/o persistent frontal sinus headache, nasal congestion, initial diarrhea that has since resolved, productive cough of clear mucous. Initial ST and PNDrainage now better. Head > chest congestion. Trouble sleeping due to cough.   No fevers/chills, ear or tooth pain, dyspnea or wheezing. No rhinorrhea. No body aches.   Treating with mucinex.   No sick contacts at home.  No smokers at home.  No h/o asthma.   Tends to get sinus infections.      Relevant past medical, surgical, family and social history reviewed and updated as indicated. Interim medical history since our last visit reviewed. Allergies and medications reviewed and updated. Outpatient Medications Prior to Visit  Medication Sig Dispense Refill  . allopurinol (ZYLOPRIM) 300 MG tablet Take 0.5 tablets (150 mg total) by mouth daily. 45 tablet 4  . amLODipine (NORVASC) 2.5 MG tablet Take 1 tablet (2.5 mg total) by mouth daily. 90 tablet 4  . omeprazole (PRILOSEC OTC) 20 MG tablet Take 20 mg by mouth every morning.      No facility-administered medications prior to visit.      Per HPI unless specifically indicated in ROS section below Review of Systems Objective:    BP 124/80 (BP Location: Left Arm, Patient Position: Sitting, Cuff Size: Normal)   Pulse 69   Temp 98.4 F (36.9 C) (Oral)   Ht 5' 7.75" (1.721 m)   Wt 209 lb 4 oz (94.9 kg)   SpO2 97%   BMI 32.05 kg/m   Wt Readings from Last 3 Encounters:   11/01/18 209 lb 4 oz (94.9 kg)  09/06/18 205 lb (93 kg)  09/05/17 212 lb 4 oz (96.3 kg)    Physical Exam Vitals signs and nursing note reviewed.  Constitutional:      General: He is not in acute distress.    Appearance: Normal appearance. He is well-developed.  HENT:     Head: Normocephalic and atraumatic.     Right Ear: Hearing, tympanic membrane, ear canal and external ear normal.     Left Ear: Hearing, tympanic membrane, ear canal and external ear normal.     Nose: Congestion present. No mucosal edema (and erythema) or rhinorrhea.     Right Sinus: No maxillary sinus tenderness or frontal sinus tenderness.     Left Sinus: No maxillary sinus tenderness or frontal sinus tenderness.     Mouth/Throat:     Mouth: Mucous membranes are moist.     Pharynx: Uvula midline. Posterior oropharyngeal erythema (mild) present. No oropharyngeal exudate.     Tonsils: No tonsillar abscesses.  Eyes:     General: No scleral icterus.    Conjunctiva/sclera: Conjunctivae normal.     Pupils: Pupils are equal, round, and reactive to light.  Neck:     Musculoskeletal: Normal range of motion and neck supple.  Cardiovascular:  Rate and Rhythm: Normal rate and regular rhythm.     Pulses: Normal pulses.     Heart sounds: Normal heart sounds. No murmur.  Pulmonary:     Effort: Pulmonary effort is normal. No respiratory distress.     Breath sounds: Normal breath sounds. No wheezing, rhonchi or rales.  Lymphadenopathy:     Cervical: No cervical adenopathy.  Skin:    General: Skin is warm and dry.     Findings: No rash.  Neurological:     Mental Status: He is alert.       Assessment & Plan:   Problem List Items Addressed This Visit    Acute sinusitis - Primary    Anticipate viral given short duration. Supportive care reviewed as per instructions. Update if not improving with treatment. Pt agrees with plan. Red flags to consider abx course provided - he will call us if this occurs.        Relevant Medications   fluticasone (FLONASE) 50 MCG/ACT nasal spray       Meds ordered this encounter  Medications  . fluticasone (FLONASE) 50 MCG/ACT nasal spray    Sig: Place 2 sprays into both nostrils daily.    Dispense:  16 g    Refill:  1   No orders of the defined types were placed in this encounter.  Patient Instructions  You have a sinus infection, likely viral. Try flonase nasal steroid.  Push fluids and plenty of rest. Take ibuprofen 400-600mg  with meals.  Nasal saline irrigation or neti pot to help drain sinuses. May use plain mucinex with plenty of fluid to help mobilize mucous. Please let us know if fever >101.5, ongoing symptoms past 7-10 days, or worsening facial pain or worsening productive cough. These would be signs of bacterial sinus infection you would need antibiotic for.    Follow up plan: No follow-ups on file.  Ria Bush, MD

## 2018-11-14 MED FILL — ALLOPURINOL 300 MG TABS: 300 | 90 days supply | Qty: 45 | Fill #0

## 2018-11-27 MED FILL — AMLODIPINE 2.5 MG TABLET: 2.5 | 90 days supply | Qty: 90 | Fill #0

## 2019-02-08 MED FILL — ALLOPURINOL 300 MG TAB: 300 | 90 days supply | Qty: 45 | Fill #1

## 2019-02-27 MED FILL — AMLODIPINE 2.5 MG TABLET: 2.5 | 90 days supply | Qty: 90 | Fill #1

## 2019-03-30 DIAGNOSIS — H5203 Hypermetropia, bilateral: Secondary | ICD-10-CM | POA: Diagnosis not present

## 2019-03-30 DIAGNOSIS — H524 Presbyopia: Secondary | ICD-10-CM | POA: Diagnosis not present

## 2019-03-30 DIAGNOSIS — H52223 Regular astigmatism, bilateral: Secondary | ICD-10-CM | POA: Diagnosis not present

## 2019-05-07 MED FILL — ALLOPURINOL 300 MG TABS: 300 | 90 days supply | Qty: 45 | Fill #2

## 2019-05-26 MED FILL — AMLODIPINE 2.5 MG TABLET: 2.5 | 90 days supply | Qty: 90 | Fill #2

## 2019-07-29 MED FILL — ALLOPURINOL 300 MG TABS: 300 | 90 days supply | Qty: 45 | Fill #3

## 2019-08-22 MED FILL — AMLODIPINE 2.5 MG TABLET: 2.5 | 90 days supply | Qty: 90 | Fill #3

## 2019-09-16 ENCOUNTER — Other Ambulatory Visit: Payer: Self-pay | Admitting: Family Medicine

## 2019-09-16 DIAGNOSIS — M1A09X0 Idiopathic chronic gout, multiple sites, without tophus (tophi): Secondary | ICD-10-CM

## 2019-09-16 DIAGNOSIS — I10 Essential (primary) hypertension: Secondary | ICD-10-CM

## 2019-09-16 DIAGNOSIS — E785 Hyperlipidemia, unspecified: Secondary | ICD-10-CM

## 2019-09-16 DIAGNOSIS — N400 Enlarged prostate without lower urinary tract symptoms: Secondary | ICD-10-CM

## 2019-09-17 ENCOUNTER — Telehealth: Payer: Self-pay

## 2019-09-17 NOTE — Telephone Encounter (Signed)
LVM w COVID screen, front door and back lab 2.1.2021 TLJ

## 2019-09-19 ENCOUNTER — Other Ambulatory Visit (INDEPENDENT_AMBULATORY_CARE_PROVIDER_SITE_OTHER): Payer: 59

## 2019-09-19 ENCOUNTER — Other Ambulatory Visit: Payer: Self-pay

## 2019-09-19 DIAGNOSIS — E785 Hyperlipidemia, unspecified: Secondary | ICD-10-CM | POA: Diagnosis not present

## 2019-09-19 DIAGNOSIS — M1A09X0 Idiopathic chronic gout, multiple sites, without tophus (tophi): Secondary | ICD-10-CM | POA: Diagnosis not present

## 2019-09-19 DIAGNOSIS — I10 Essential (primary) hypertension: Secondary | ICD-10-CM

## 2019-09-19 DIAGNOSIS — N400 Enlarged prostate without lower urinary tract symptoms: Secondary | ICD-10-CM

## 2019-09-19 LAB — COMPREHENSIVE METABOLIC PANEL
ALT: 15 U/L (ref 0–53)
AST: 13 U/L (ref 0–37)
Albumin: 4.3 g/dL (ref 3.5–5.2)
Alkaline Phosphatase: 71 U/L (ref 39–117)
BUN: 22 mg/dL (ref 6–23)
CO2: 30 mEq/L (ref 19–32)
Calcium: 9.2 mg/dL (ref 8.4–10.5)
Chloride: 106 mEq/L (ref 96–112)
Creatinine, Ser: 0.98 mg/dL (ref 0.40–1.50)
GFR: 78.08 mL/min (ref >60.00)
Glucose, Bld: 97 mg/dL (ref 70–99)
Potassium: 4.3 mEq/L (ref 3.5–5.1)
Sodium: 143 mEq/L (ref 135–145)
Total Bilirubin: 0.6 mg/dL (ref 0.2–1.2)
Total Protein: 6.7 g/dL (ref 6.0–8.3)

## 2019-09-19 LAB — LIPID PANEL
Cholesterol: 186 mg/dL (ref 0–200)
HDL: 43.5 mg/dL (ref >39.00)
LDL Cholesterol: 128 mg/dL — ABNORMAL HIGH (ref 0–99)
NonHDL: 142.61
Total CHOL/HDL Ratio: 4
Triglycerides: 74 mg/dL (ref 0.0–149.0)
VLDL: 14.8 mg/dL (ref 0.0–40.0)

## 2019-09-19 LAB — MICROALBUMIN / CREATININE URINE RATIO
Creatinine,U: 242.3 mg/dL
Microalb Creat Ratio: 0.6 mg/g (ref 0.0–30.0)
Microalb, Ur: 1.5 mg/dL (ref 0.0–1.9)

## 2019-09-19 LAB — PSA: PSA: 0.26 ng/mL (ref 0.10–4.00)

## 2019-09-19 LAB — URIC ACID: Uric Acid, Serum: 5 mg/dL (ref 4.0–7.8)

## 2019-09-24 ENCOUNTER — Other Ambulatory Visit: Payer: Self-pay

## 2019-09-24 ENCOUNTER — Encounter: Payer: Self-pay | Admitting: Family Medicine

## 2019-09-24 ENCOUNTER — Ambulatory Visit (INDEPENDENT_AMBULATORY_CARE_PROVIDER_SITE_OTHER): Payer: 59 | Admitting: Family Medicine

## 2019-09-24 VITALS — BP 124/82 | HR 72 | Temp 97.7°F | Ht 67.75 in | Wt 202.4 lb

## 2019-09-24 DIAGNOSIS — E785 Hyperlipidemia, unspecified: Secondary | ICD-10-CM | POA: Diagnosis not present

## 2019-09-24 DIAGNOSIS — Z7729 Contact with and (suspected ) exposure to other hazardous substances: Secondary | ICD-10-CM | POA: Diagnosis not present

## 2019-09-24 DIAGNOSIS — I10 Essential (primary) hypertension: Secondary | ICD-10-CM | POA: Diagnosis not present

## 2019-09-24 DIAGNOSIS — Z0000 Encounter for general adult medical examination without abnormal findings: Secondary | ICD-10-CM | POA: Diagnosis not present

## 2019-09-24 DIAGNOSIS — N400 Enlarged prostate without lower urinary tract symptoms: Secondary | ICD-10-CM | POA: Diagnosis not present

## 2019-09-24 DIAGNOSIS — M1A09X0 Idiopathic chronic gout, multiple sites, without tophus (tophi): Secondary | ICD-10-CM

## 2019-09-24 NOTE — Assessment & Plan Note (Signed)
Yearly CXR through work - I asked him to bring me copy of latest result.

## 2019-09-24 NOTE — Assessment & Plan Note (Signed)
Chronic, stable. Continue amlodipine 2.5mg  daily.

## 2019-09-24 NOTE — Assessment & Plan Note (Signed)
Chronic, stable on current allopurinol dose without recent gout flare - continue.

## 2019-09-24 NOTE — Assessment & Plan Note (Signed)
Preventative protocols reviewed and updated unless pt declined. Discussed healthy diet and lifestyle.  

## 2019-09-24 NOTE — Assessment & Plan Note (Signed)
Chronic, stable off meds. Deteriorated control noted - encouraged low chol diet to help improve cholesterol readings.  The 10-year ASCVD risk score Mikey Bussing [name]., et al., 2013) is: 9.1%   Values used to calculate the score:     Age: 59 years     Sex: Male     Is Non-Hispanic African American: No     Diabetic: No     Tobacco smoker: No     Systolic Blood Pressure: [id number] mmHg     Is BP treated: Yes     HDL Cholesterol: 43.5 mg/dL     Total Cholesterol: 186 mg/dL

## 2019-09-24 NOTE — Progress Notes (Signed)
This visit was conducted in person.  BP 124/82 (BP Location: Left Arm, Patient Position: Sitting, Cuff Size: Normal)   Pulse 72   Temp 97.7 F (36.5 C) (Temporal)   Ht 5' 7.75" (1.721 m)   Wt 202 lb 7 oz (91.8 kg)   SpO2 96%   BMI 31.01 kg/m    CC: CPE Subjective:    Patient ID: [name], male    DOB: Mar 05, 1961, 59 y.o.   MRN: [id number]  HPI: LEOPOLD SMYERS is a 59 y.o. male presenting on 09/24/2019 for Annual Exam   Works at [employer] ([employer]) around [place] 39 yrs exposure - receives chest xray once a year at work. He does use respirator as well. Had TB blood test.   Wife has melanoma with met to brain/lung on keytruda - unfortunately new brain lesion recently found.   Preventative: COLONOSCOPY 04/2012 mod diverticulosis, 3 polyps - benign, rec rpt 10 yrs (Pyrtle)  Prostate screening -s/p green laser TURP by Dr Yves Dill 2016 - released from urology care. Yearly PSA at our office.  Flu shotyearly Td 1914,7829, Tdap 08/2018 Shingrix - discussed - interested  Covid - discussed - interested  Seat beltuse discussed. Sunscreen use dicussed. No changing moles on skin. Never smoker. Chewing tobacco - cutting back. Alcohol -none  Dentist q6 mo  Eye exam yearly   Caffeine: 1 mountain dew/day  Lives with wife, mother in law, 1dog- american bully Occupation: works at [employer]  Activity: no regular exercise, walks dog daily (30 min) - active at work Diet:somewater, vegetables. Likes junk food     Relevant past medical, surgical, family and social history reviewed and updated as indicated. Interim medical history since our last visit reviewed. Allergies and medications reviewed and updated. Outpatient Medications Prior to Visit  Medication Sig Dispense Refill  . allopurinol (ZYLOPRIM) 300 MG tablet Take 0.5 tablets (150 mg total) by mouth daily. 45 tablet 4  . amLODipine (NORVASC) 2.5 MG tablet Take 1 tablet (2.5 mg total) by mouth daily. 90  tablet 4  . fluticasone (FLONASE) 50 MCG/ACT nasal spray Place 2 sprays into both nostrils daily. (Patient taking differently: Place 2 sprays into both nostrils daily. As needed) 16 g 1  . omeprazole (PRILOSEC OTC) 20 MG tablet Take 20 mg by mouth every morning.      No facility-administered medications prior to visit.     Per HPI unless specifically indicated in ROS section below Review of Systems  Constitutional: Negative for activity change, appetite change, chills, fatigue, fever and unexpected weight change.  HENT: Negative for hearing loss.   Eyes: Negative for visual disturbance.  Respiratory: Negative for cough, chest tightness, shortness of breath and wheezing.   Cardiovascular: Negative for chest pain, palpitations and leg swelling.  Gastrointestinal: Negative for abdominal distention, abdominal pain, blood in stool, constipation, diarrhea, nausea and vomiting.  Genitourinary: Negative for difficulty urinating and hematuria.  Musculoskeletal: Negative for arthralgias, myalgias and neck pain.  Skin: Negative for rash.  Neurological: Negative for dizziness, seizures, syncope and headaches.  Hematological: Negative for adenopathy. Does not bruise/bleed easily.  Psychiatric/Behavioral: Negative for dysphoric mood. The patient is not nervous/anxious.    Objective:    BP 124/82 (BP Location: Left Arm, Patient Position: Sitting, Cuff Size: Normal)   Pulse 72   Temp 97.7 F (36.5 C) (Temporal)   Ht 5' 7.75" (1.721 m)   Wt 202 lb 7 oz (91.8 kg)   SpO2 96%   BMI 31.01  kg/m   Wt Readings from Last 3 Encounters:  09/24/19 202 lb 7 oz (91.8 kg)  11/01/18 209 lb 4 oz (94.9 kg)  09/06/18 205 lb (93 kg)    Physical Exam Vitals and nursing note reviewed.  Constitutional:      General: He is not in acute distress.    Appearance: Normal appearance. He is well-developed. He is not ill-appearing.  HENT:     Head: Normocephalic and atraumatic.     Right Ear: Hearing, tympanic  membrane, ear canal and external ear normal.     Left Ear: Hearing, tympanic membrane, ear canal and external ear normal.     Mouth/Throat:     Pharynx: Uvula midline.  Eyes:     General: No scleral icterus.    Extraocular Movements: Extraocular movements intact.     Conjunctiva/sclera: Conjunctivae normal.     Pupils: Pupils are equal, round, and reactive to light.  Neck:     Thyroid: No thyromegaly or thyroid tenderness.  Cardiovascular:     Rate and Rhythm: Normal rate and regular rhythm.     Pulses: Normal pulses.          Radial pulses are 2+ on the right side and 2+ on the left side.     Heart sounds: Normal heart sounds. No murmur.  Pulmonary:     Effort: Pulmonary effort is normal. No respiratory distress.     Breath sounds: Normal breath sounds. No wheezing, rhonchi or rales.  Abdominal:     General: Abdomen is flat. Bowel sounds are normal. There is no distension.     Palpations: Abdomen is soft. There is no mass.     Tenderness: There is no abdominal tenderness. There is no guarding or rebound.     Hernia: No hernia is present.  Musculoskeletal:        General: Normal range of motion.     Cervical back: Normal range of motion and neck supple.     Right lower leg: No edema.     Left lower leg: No edema.  Lymphadenopathy:     Cervical: No cervical adenopathy.  Skin:    General: Skin is warm and dry.     Findings: No rash.  Neurological:     General: No focal deficit present.     Mental Status: He is alert and oriented to person, place, and time.     Comments: CN grossly intact, station and gait intact  Psychiatric:        Mood and Affect: Mood normal.        Behavior: Behavior normal.        Thought Content: Thought content normal.        Judgment: Judgment normal.       Results for orders placed or performed in visit on 09/19/19  Microalbumin / creatinine urine ratio  Result Value Ref Range   Microalb, Ur 1.5 0.0 - 1.9 mg/dL   Creatinine,U 242.3 mg/dL    Microalb Creat Ratio 0.6 0.0 - 30.0 mg/g  PSA  Result Value Ref Range   PSA 0.26 0.10 - 4.00 ng/mL  Uric acid  Result Value Ref Range   Uric Acid, Serum 5.0 4.0 - 7.8 mg/dL  Comprehensive metabolic panel  Result Value Ref Range   Sodium 143 135 - 145 mEq/L   Potassium 4.3 3.5 - 5.1 mEq/L   Chloride 106 96 - 112 mEq/L   CO2 30 19 - 32 mEq/L   Glucose, Bld 97 70 -  99 mg/dL   BUN 22 6 - 23 mg/dL   Creatinine, Ser 0.98 0.40 - 1.50 mg/dL   Total Bilirubin 0.6 0.2 - 1.2 mg/dL   Alkaline Phosphatase 71 39 - 117 U/L   AST 13 0 - 37 U/L   ALT 15 0 - 53 U/L   Total Protein 6.7 6.0 - 8.3 g/dL   Albumin 4.3 3.5 - 5.2 g/dL   GFR 78.08 >60.00 mL/min   Calcium 9.2 8.4 - 10.5 mg/dL  Lipid panel  Result Value Ref Range   Cholesterol 186 0 - 200 mg/dL   Triglycerides 74.0 0.0 - 149.0 mg/dL   HDL 43.50 >39.00 mg/dL   VLDL 14.8 0.0 - 40.0 mg/dL   LDL Cholesterol 128 (H) 0 - 99 mg/dL   Total CHOL/HDL Ratio 4    NonHDL 142.61    Assessment & Plan:  This visit occurred during the SARS-CoV-2 public health emergency.  Safety protocols were in place, including screening questions prior to the visit, additional usage of staff PPE, and extensive cleaning of exam room while observing appropriate contact time as indicated for disinfecting solutions.   Problem List Items Addressed This Visit    HTN (hypertension)    Chronic, stable. Continue amlodipine 2.14m daily.       HLD (hyperlipidemia)    Chronic, stable off meds. Deteriorated control noted - encouraged low chol diet to help improve cholesterol readings.  The 10-year ASCVD risk score (Mikey BussingDC JBrooke Bonito, et al., 2013) is: 9.1%   Values used to calculate the score:     Age: [age]years     Sex: Male     Is Non-Hispanic African American: No     Diabetic: No     Tobacco smoker: No     Systolic Blood Pressure: 1532mmHg     Is BP treated: Yes     HDL Cholesterol: 43.5 mg/dL     Total Cholesterol: 186 mg/dL       Healthcare maintenance - Primary     Preventative protocols reviewed and updated unless pt declined. Discussed healthy diet and lifestyle.       Gout    Chronic, stable on current allopurinol dose without recent gout flare - continue.       Exposure to silica    Yearly CXR through work - I asked him to bring me copy of latest result.       Benign prostatic hyperplasia    S/p photovaporization. PSA stable.           No orders of the defined types were placed in this encounter.  No orders of the defined types were placed in this encounter.  Patient instructions: Bring me copy of latest xrays at your convenience.  You are doing well today. Work on low cholesterol diet. Return as needed or in 1 year for next physical.   Follow up plan: Return in about 1 year (around 09/23/2020) for annual exam, prior fasting for blood work.  JRia Bush MD

## 2019-09-24 NOTE — Assessment & Plan Note (Signed)
S/p photovaporization. PSA stable.

## 2019-09-24 NOTE — Patient Instructions (Signed)
Bring me copy of latest xrays at your convenience.  You are doing well today. Work on low cholesterol diet. Return as needed or in 1 year for next physical.   Health Maintenance, Male Adopting a healthy lifestyle and getting preventive care are important in promoting health and wellness. Ask your health care provider about:  The right schedule for you to have regular tests and exams.  Things you can do on your own to prevent diseases and keep yourself healthy. What should I know about diet, weight, and exercise? Eat a healthy diet   Eat a diet that includes plenty of vegetables, fruits, low-fat dairy products, and lean protein.  Do not eat a lot of foods that are high in solid fats, added sugars, or sodium. Maintain a healthy weight Body mass index (BMI) is a measurement that can be used to identify possible weight problems. It estimates body fat based on height and weight. Your health care provider can help determine your BMI and help you achieve or maintain a healthy weight. Get regular exercise Get regular exercise. This is one of the most important things you can do for your health. Most adults should:  Exercise for at least 150 minutes each week. The exercise should increase your heart rate and make you sweat (moderate-intensity exercise).  Do strengthening exercises at least twice a week. This is in addition to the moderate-intensity exercise.  Spend less time sitting. Even light physical activity can be beneficial. Watch cholesterol and blood lipids Have your blood tested for lipids and cholesterol at 60 years of age, then have this test every 5 years. You may need to have your cholesterol levels checked more often if:  Your lipid or cholesterol levels are high.  You are older than 60 years of age.  You are at high risk for heart disease. What should I know about cancer screening? Many types of cancers can be detected early and may often be prevented. Depending on your  health history and family history, you may need to have cancer screening at various ages. This may include screening for:  Colorectal cancer.  Prostate cancer.  Skin cancer.  Lung cancer. What should I know about heart disease, diabetes, and high blood pressure? Blood pressure and heart disease  High blood pressure causes heart disease and increases the risk of stroke. This is more likely to develop in people who have high blood pressure readings, are of African descent, or are overweight.  Talk with your health care provider about your target blood pressure readings.  Have your blood pressure checked: ? Every 3-5 years if you are 60-54 years of age. ? Every year if you are 78 years old or older.  If you are between the ages of 62 and 61 and are a current or former smoker, ask your health care provider if you should have a one-time screening for abdominal aortic aneurysm (AAA). Diabetes Have regular diabetes screenings. This checks your fasting blood sugar level. Have the screening done:  Once every three years after age 74 if you are at a normal weight and have a low risk for diabetes.  More often and at a younger age if you are overweight or have a high risk for diabetes. What should I know about preventing infection? Hepatitis B If you have a higher risk for hepatitis B, you should be screened for this virus. Talk with your health care provider to find out if you are at risk for hepatitis B infection. Hepatitis C  Blood testing is recommended for:  Everyone born from [birth year] through 1965.  Anyone with known risk factors for hepatitis C. Sexually transmitted infections (STIs)  You should be screened each year for STIs, including gonorrhea and chlamydia, if: ? You are sexually active and are younger than 60 years of age. ? You are older than 60 years of age and your health care provider tells you that you are at risk for this type of infection. ? Your sexual activity has changed  since you were last screened, and you are at increased risk for chlamydia or gonorrhea. Ask your health care provider if you are at risk.  Ask your health care provider about whether you are at high risk for HIV. Your health care provider may recommend a prescription medicine to help prevent HIV infection. If you choose to take medicine to prevent HIV, you should first get tested for HIV. You should then be tested every 3 months for as long as you are taking the medicine. Follow these instructions at home: Lifestyle  Do not use any products that contain nicotine or tobacco, such as cigarettes, e-cigarettes, and chewing tobacco. If you need help quitting, ask your health care provider.  Do not use street drugs.  Do not share needles.  Ask your health care provider for help if you need support or information about quitting drugs. Alcohol use  Do not drink alcohol if your health care provider tells you not to drink.  If you drink alcohol: ? Limit how much you have to 0-2 drinks a day. ? Be aware of how much alcohol is in your drink. In the [place] one drink equals one 12 oz bottle of beer (355 mL), one 5 oz glass of wine (148 mL), or one 1 oz glass of hard liquor (44 mL). General instructions  Schedule regular health, dental, and eye exams.  Stay current with your vaccines.  Tell your health care provider if: ? You often feel depressed. ? You have ever been abused or do not feel safe at home. Summary  Adopting a healthy lifestyle and getting preventive care are important in promoting health and wellness.  Follow your health care provider's instructions about healthy diet, exercising, and getting tested or screened for diseases.  Follow your health care provider's instructions on monitoring your cholesterol and blood pressure. This information is not intended to replace advice given to you by your health care provider. Make sure you discuss any questions you have with your health care  provider. Document Revised: 07/26/2018 Document Reviewed: 07/26/2018 Elsevier Patient Education  2020 [employer].

## 2019-10-31 ENCOUNTER — Other Ambulatory Visit: Payer: Self-pay | Admitting: Family Medicine

## 2019-10-31 MED FILL — ALLOPURINOL 300 MG TABS: 300 | 90 days supply | Qty: 45 | Fill #0

## 2019-11-11 ENCOUNTER — Ambulatory Visit: Payer: 59 | Attending: Internal Medicine

## 2019-11-11 DIAGNOSIS — Z23 Encounter for immunization: Secondary | ICD-10-CM

## 2019-11-11 NOTE — Progress Notes (Signed)
   Covid-19 Vaccination Clinic  Name:  ELVIN GIOVINO    MRN: [id number] DOB: 04-Jun-1961  11/11/2019  Mr. Dauphinais was observed post Covid-19 immunization for 15 minutes without incident. He was provided with Vaccine Information Sheet and instruction to access the V-Safe system.   Mr. Trevorrow was instructed to call 911 with any severe reactions post vaccine: Marland Kitchen Difficulty breathing  . Swelling of face and throat  . A fast heartbeat  . A bad rash all over body  . Dizziness and weakness   Immunizations Administered    Name Date Dose VIS Date Route   Pfizer COVID-19 Vaccine 11/11/2019  8:34 AM 0.3 mL 07/27/2019 Intramuscular   Manufacturer: [place]   Lot: [id number]   [place]: [id number]

## 2019-11-24 ENCOUNTER — Other Ambulatory Visit: Payer: Self-pay | Admitting: Family Medicine

## 2019-11-26 ENCOUNTER — Other Ambulatory Visit: Payer: Self-pay | Admitting: Family Medicine

## 2019-11-26 MED FILL — AMLODIPINE 2.5 MG TABLET: 2.5 | 90 days supply | Qty: 90 | Fill #0

## 2019-12-04 ENCOUNTER — Ambulatory Visit: Payer: 59 | Attending: Internal Medicine

## 2019-12-04 DIAGNOSIS — Z23 Encounter for immunization: Secondary | ICD-10-CM

## 2019-12-04 NOTE — Progress Notes (Signed)
   Covid-19 Vaccination Clinic  Name:  Timothy Landry    MRN: [id number] DOB: August 01, 1961  12/04/2019  [name] was observed post Covid-19 immunization for 15 minutes without incident. He was provided with Vaccine Information Sheet and instruction to access the V-Safe system.   [name] was instructed to call 911 with any severe reactions post vaccine: Marland Kitchen Difficulty breathing  . Swelling of face and throat  . A fast heartbeat  . A bad rash all over body  . Dizziness and weakness   Immunizations Administered    Name Date Dose VIS Date Route   Pfizer COVID-19 Vaccine 12/04/2019  3:55 PM 0.3 mL 10/10/2018 Intramuscular   Manufacturer: [place]   Lot: [id number]   [place]: [id number]

## 2020-01-30 MED FILL — ALLOPURINOL 300 MG TABS: 300 | 90 days supply | Qty: 45 | Fill #1

## 2020-02-22 MED FILL — AMLODIPINE 2.5 MG TABLET: 2.5 | 90 days supply | Qty: 90 | Fill #1

## 2020-04-22 MED FILL — ALLOPURINOL 300 MG TABS: 300 | 90 days supply | Qty: 45 | Fill #2

## 2020-05-23 MED FILL — AMLODIPINE BESYLATE 2.5 MG: 2.5 | 90 days supply | Qty: 90 | Fill #2

## 2020-07-29 ENCOUNTER — Other Ambulatory Visit: Payer: Self-pay | Admitting: Family Medicine

## 2020-07-31 MED FILL — ALLOPURINOL 300 MG TABS: 300 | 90 days supply | Qty: 45 | Fill #3

## 2020-08-18 MED FILL — AMLODIPINE BESYLATE 2.5 MG: 2.5 | 90 days supply | Qty: 90 | Fill #3

## 2020-10-13 ENCOUNTER — Other Ambulatory Visit: Payer: Self-pay | Admitting: Family Medicine

## 2020-10-14 NOTE — Telephone Encounter (Signed)
Pharmacy requests refill on: Allopurinol 300 mg   LAST REFILL: 10/31/2019 (Q-45, R-3) LAST OV: 09/24/2019 NEXT OV: 12/19/2020 PHARMACY: [facility]  Earliest Fill Date: 10/30/2020

## 2020-10-21 ENCOUNTER — Other Ambulatory Visit: Payer: Self-pay | Admitting: Family Medicine

## 2020-10-21 MED FILL — ALLOPURINOL 300 MG TABS: 300 | 90 days supply | Qty: 45 | Fill #0

## 2020-10-21 NOTE — Telephone Encounter (Signed)
Pharmacy requests refill on: Allopurinol 300 mg   LAST REFILL: 10/31/2019 (Q-45, R-3) LAST OV: 09/24/2019 NEXT OV: 12/19/2020 PHARMACY: [place]

## 2020-11-05 ENCOUNTER — Other Ambulatory Visit (HOSPITAL_BASED_OUTPATIENT_CLINIC_OR_DEPARTMENT_OTHER): Payer: Self-pay

## 2020-11-07 ENCOUNTER — Other Ambulatory Visit (HOSPITAL_BASED_OUTPATIENT_CLINIC_OR_DEPARTMENT_OTHER): Payer: Self-pay

## 2020-11-20 ENCOUNTER — Other Ambulatory Visit (HOSPITAL_COMMUNITY): Payer: Self-pay

## 2020-11-20 MED FILL — Amlodipine Besylate Tab 2.5 MG (Base Equivalent): ORAL | 90 days supply | Qty: 90 | Fill #0 | Status: AC

## 2020-11-26 ENCOUNTER — Other Ambulatory Visit (HOSPITAL_COMMUNITY): Payer: Self-pay

## 2020-12-09 ENCOUNTER — Other Ambulatory Visit: Payer: Self-pay | Admitting: Family Medicine

## 2020-12-09 DIAGNOSIS — N400 Enlarged prostate without lower urinary tract symptoms: Secondary | ICD-10-CM

## 2020-12-09 DIAGNOSIS — M1A09X0 Idiopathic chronic gout, multiple sites, without tophus (tophi): Secondary | ICD-10-CM

## 2020-12-09 DIAGNOSIS — I10 Essential (primary) hypertension: Secondary | ICD-10-CM

## 2020-12-09 DIAGNOSIS — E785 Hyperlipidemia, unspecified: Secondary | ICD-10-CM

## 2020-12-11 ENCOUNTER — Other Ambulatory Visit: Payer: Self-pay

## 2020-12-11 ENCOUNTER — Other Ambulatory Visit (INDEPENDENT_AMBULATORY_CARE_PROVIDER_SITE_OTHER): Payer: 59

## 2020-12-11 DIAGNOSIS — N400 Enlarged prostate without lower urinary tract symptoms: Secondary | ICD-10-CM

## 2020-12-11 DIAGNOSIS — E785 Hyperlipidemia, unspecified: Secondary | ICD-10-CM

## 2020-12-11 DIAGNOSIS — M1A09X0 Idiopathic chronic gout, multiple sites, without tophus (tophi): Secondary | ICD-10-CM

## 2020-12-11 DIAGNOSIS — I10 Essential (primary) hypertension: Secondary | ICD-10-CM | POA: Diagnosis not present

## 2020-12-11 LAB — COMPREHENSIVE METABOLIC PANEL
ALT: 21 U/L (ref 0–53)
AST: 17 U/L (ref 0–37)
Albumin: 4.3 g/dL (ref 3.5–5.2)
Alkaline Phosphatase: 77 U/L (ref 39–117)
BUN: 16 mg/dL (ref 6–23)
CO2: 31 mEq/L (ref 19–32)
Calcium: 9 mg/dL (ref 8.4–10.5)
Chloride: 105 mEq/L (ref 96–112)
Creatinine, Ser: 0.93 mg/dL (ref 0.40–1.50)
GFR: 88.93 mL/min (ref >60.00)
Glucose, Bld: 93 mg/dL (ref 70–99)
Potassium: 4.6 mEq/L (ref 3.5–5.1)
Sodium: 143 mEq/L (ref 135–145)
Total Bilirubin: 0.5 mg/dL (ref 0.2–1.2)
Total Protein: 6.9 g/dL (ref 6.0–8.3)

## 2020-12-11 LAB — CBC WITH DIFFERENTIAL/PLATELET
Basophils Absolute: 0 10*3/uL (ref 0.0–0.1)
Basophils Relative: 0.4 % (ref 0.0–3.0)
Eosinophils Absolute: 0.1 10*3/uL (ref 0.0–0.7)
Eosinophils Relative: 2 % (ref 0.0–5.0)
HCT: 49.1 % (ref 39.0–52.0)
Hemoglobin: 16.3 g/dL (ref 13.0–17.0)
Lymphocytes Relative: 23.5 % (ref 12.0–46.0)
Lymphs Abs: 1.4 10*3/uL (ref 0.7–4.0)
MCHC: 33.2 g/dL (ref 30.0–36.0)
MCV: 90.5 fl (ref 78.0–100.0)
Monocytes Absolute: 0.4 10*3/uL (ref 0.1–1.0)
Monocytes Relative: 6.9 % (ref 3.0–12.0)
Neutro Abs: 4.1 10*3/uL (ref 1.4–7.7)
Neutrophils Relative %: 67.2 % (ref 43.0–77.0)
Platelets: 179 10*3/uL (ref 150.0–400.0)
RBC: 5.42 Mil/uL (ref 4.22–5.81)
RDW: 13.6 % (ref 11.5–15.5)
WBC: 6.1 10*3/uL (ref 4.0–10.5)

## 2020-12-11 LAB — LIPID PANEL
Cholesterol: 193 mg/dL (ref 0–200)
HDL: 44.2 mg/dL (ref >39.00)
LDL Cholesterol: 132 mg/dL — ABNORMAL HIGH (ref 0–99)
NonHDL: 148.87
Total CHOL/HDL Ratio: 4
Triglycerides: 85 mg/dL (ref 0.0–149.0)
VLDL: 17 mg/dL (ref 0.0–40.0)

## 2020-12-11 LAB — MICROALBUMIN / CREATININE URINE RATIO
Creatinine,U: 102.4 mg/dL
Microalb Creat Ratio: 0.7 mg/g (ref 0.0–30.0)
Microalb, Ur: <0.7 mg/dL (ref 0.0–1.9)

## 2020-12-11 LAB — URIC ACID: Uric Acid, Serum: 4.2 mg/dL (ref 4.0–7.8)

## 2020-12-11 LAB — PSA: PSA: 0.22 ng/mL (ref 0.10–4.00)

## 2020-12-19 ENCOUNTER — Other Ambulatory Visit: Payer: Self-pay

## 2020-12-19 ENCOUNTER — Encounter: Payer: Self-pay | Admitting: Family Medicine

## 2020-12-19 ENCOUNTER — Other Ambulatory Visit (HOSPITAL_COMMUNITY): Payer: Self-pay

## 2020-12-19 ENCOUNTER — Ambulatory Visit (INDEPENDENT_AMBULATORY_CARE_PROVIDER_SITE_OTHER): Payer: 59 | Admitting: Family Medicine

## 2020-12-19 VITALS — BP 136/84 | HR 78 | Temp 97.5°F | Ht 68.0 in | Wt 223.3 lb

## 2020-12-19 DIAGNOSIS — K219 Gastro-esophageal reflux disease without esophagitis: Secondary | ICD-10-CM | POA: Diagnosis not present

## 2020-12-19 DIAGNOSIS — K449 Diaphragmatic hernia without obstruction or gangrene: Secondary | ICD-10-CM

## 2020-12-19 DIAGNOSIS — Z7729 Contact with and (suspected ) exposure to other hazardous substances: Secondary | ICD-10-CM

## 2020-12-19 DIAGNOSIS — I10 Essential (primary) hypertension: Secondary | ICD-10-CM

## 2020-12-19 DIAGNOSIS — N400 Enlarged prostate without lower urinary tract symptoms: Secondary | ICD-10-CM

## 2020-12-19 DIAGNOSIS — Z23 Encounter for immunization: Secondary | ICD-10-CM | POA: Diagnosis not present

## 2020-12-19 DIAGNOSIS — M1A09X0 Idiopathic chronic gout, multiple sites, without tophus (tophi): Secondary | ICD-10-CM

## 2020-12-19 DIAGNOSIS — E7800 Pure hypercholesterolemia, unspecified: Secondary | ICD-10-CM

## 2020-12-19 DIAGNOSIS — Z0000 Encounter for general adult medical examination without abnormal findings: Secondary | ICD-10-CM

## 2020-12-19 MED ORDER — ALLOPURINOL 300 MG PO TABS
150.0000 mg | ORAL_TABLET | Freq: Every day | ORAL | 4 refills | Status: DC
  Filled 2020-12-19 – 2021-01-21 (×3): qty 45, 90d supply, fill #0
  Filled 2021-04-04: qty 45, 90d supply, fill #1
  Filled 2021-04-09: qty 45, 90d supply, fill #0
  Filled 2021-06-27 – 2021-07-02 (×2): qty 45, 90d supply, fill #1
  Filled 2021-09-20: qty 45, 90d supply, fill #2
  Filled 2021-12-19: qty 45, 90d supply, fill #3

## 2020-12-19 MED ORDER — AMLODIPINE BESYLATE 2.5 MG PO TABS
2.5000 mg | ORAL_TABLET | Freq: Every day | ORAL | 4 refills | Status: DC
  Filled 2020-12-19 – 2021-02-19 (×3): qty 90, 90d supply, fill #0
  Filled 2021-05-21 (×2): qty 90, 90d supply, fill #1
  Filled 2021-08-14: qty 90, 90d supply, fill #2
  Filled 2021-08-14: qty 90, 90d supply, fill #0
  Filled 2021-11-14: qty 90, 90d supply, fill #1

## 2020-12-19 NOTE — Patient Instructions (Addendum)
First shingrix vaccine today. Schedule nurse visit in 2-6 months for second and final shingrix vaccine. Good to see you today. You are doing well.  Continue current medicines.  Return as needed or in 1 year for next physical.   Health Maintenance, Male Adopting a healthy lifestyle and getting preventive care are important in promoting health and wellness. Ask your health care provider about:  The right schedule for you to have regular tests and exams.  Things you can do on your own to prevent diseases and keep yourself healthy. What should I know about diet, weight, and exercise? Eat a healthy diet  Eat a diet that includes plenty of vegetables, fruits, low-fat dairy products, and lean protein.  Do not eat a lot of foods that are high in solid fats, added sugars, or sodium.   Maintain a healthy weight Body mass index (BMI) is a measurement that can be used to identify possible weight problems. It estimates body fat based on height and weight. Your health care provider can help determine your BMI and help you achieve or maintain a healthy weight. Get regular exercise Get regular exercise. This is one of the most important things you can do for your health. Most adults should:  Exercise for at least 150 minutes each week. The exercise should increase your heart rate and make you sweat (moderate-intensity exercise).  Do strengthening exercises at least twice a week. This is in addition to the moderate-intensity exercise.  Spend less time sitting. Even light physical activity can be beneficial. Watch cholesterol and blood lipids Have your blood tested for lipids and cholesterol at 61 years of age, then have this test every 5 years. You may need to have your cholesterol levels checked more often if:  Your lipid or cholesterol levels are high.  You are older than 60 years of age.  You are at high risk for heart disease. What should I know about cancer screening? Many types of cancers  can be detected early and may often be prevented. Depending on your health history and family history, you may need to have cancer screening at various ages. This may include screening for:  Colorectal cancer.  Prostate cancer.  Skin cancer.  Lung cancer. What should I know about heart disease, diabetes, and high blood pressure? Blood pressure and heart disease  High blood pressure causes heart disease and increases the risk of stroke. This is more likely to develop in people who have high blood pressure readings, are of African descent, or are overweight.  Talk with your health care provider about your target blood pressure readings.  Have your blood pressure checked: ? Every 3-5 years if you are 72-42 years of age. ? Every year if you are 66 years old or older.  If you are between the ages of 19 and 20 and are a current or former smoker, ask your health care provider if you should have a one-time screening for abdominal aortic aneurysm (AAA). Diabetes Have regular diabetes screenings. This checks your fasting blood sugar level. Have the screening done:  Once every three years after age 81 if you are at a normal weight and have a low risk for diabetes.  More often and at a younger age if you are overweight or have a high risk for diabetes. What should I know about preventing infection? Hepatitis B If you have a higher risk for hepatitis B, you should be screened for this virus. Talk with your health care provider to find out  if you are at risk for hepatitis B infection. Hepatitis C Blood testing is recommended for:  Everyone born from [birth year] through 1965.  Anyone with known risk factors for hepatitis C. Sexually transmitted infections (STIs)  You should be screened each year for STIs, including gonorrhea and chlamydia, if: ? You are sexually active and are younger than 61 years of age. ? You are older than 61 years of age and your health care provider tells you that you are at  risk for this type of infection. ? Your sexual activity has changed since you were last screened, and you are at increased risk for chlamydia or gonorrhea. Ask your health care provider if you are at risk.  Ask your health care provider about whether you are at high risk for HIV. Your health care provider may recommend a prescription medicine to help prevent HIV infection. If you choose to take medicine to prevent HIV, you should first get tested for HIV. You should then be tested every 3 months for as long as you are taking the medicine. Follow these instructions at home: Lifestyle  Do not use any products that contain nicotine or tobacco, such as cigarettes, e-cigarettes, and chewing tobacco. If you need help quitting, ask your health care provider.  Do not use street drugs.  Do not share needles.  Ask your health care provider for help if you need support or information about quitting drugs. Alcohol use  Do not drink alcohol if your health care provider tells you not to drink.  If you drink alcohol: ? Limit how much you have to 0-2 drinks a day. ? Be aware of how much alcohol is in your drink. In the [place] one drink equals one 12 oz bottle of beer (355 mL), one 5 oz glass of wine (148 mL), or one 1 oz glass of hard liquor (44 mL). General instructions  Schedule regular health, dental, and eye exams.  Stay current with your vaccines.  Tell your health care provider if: ? You often feel depressed. ? You have ever been abused or do not feel safe at home. Summary  Adopting a healthy lifestyle and getting preventive care are important in promoting health and wellness.  Follow your health care provider's instructions about healthy diet, exercising, and getting tested or screened for diseases.  Follow your health care provider's instructions on monitoring your cholesterol and blood pressure. This information is not intended to replace advice given to you by your health care  provider. Make sure you discuss any questions you have with your health care provider. Document Revised: 07/26/2018 Document Reviewed: 07/26/2018 Elsevier Patient Education  2021 [employer].

## 2020-12-19 NOTE — Assessment & Plan Note (Addendum)
Gets yearly CXR through work - overdue for this. Will check with work.

## 2020-12-19 NOTE — Progress Notes (Signed)
Patient ID: Timothy Landry, male    DOB: 1960/12/09, 60 y.o.   MRN: [id number]  This visit was conducted in person.  BP 136/84   Pulse 78   Temp (!) 97.5 F (36.4 C) (Temporal)   Ht '5\' 8"'  (1.727 m)   Wt 223 lb 4.8 oz (101.3 kg)   SpO2 95%   BMI 33.95 kg/m    CC: CPE  Subjective:   HPI: Timothy Landry is a 60 y.o. male presenting on 12/19/2020 for Annual Exam   Works at [employer] ([employer] [place] around silica dust 39 yrs exposure - receives chest xray once a year at work - overdue for this. He does use respirator as well. Planning to retire next year 2023.   Wife has melanoma with met to brain/lung on [place] - she is overall doing well - had brain surgery 2021.   Home BP readings overall well controlled.   Preventative: COLONOSCOPY 04/2012 mod diverticulosis, 3 polyps - benign, rec rpt 10 yrs (Pyrtle)  Prostate screening -s/p green laser TURP by Dr Threasa Alpha from urology care. YearlyPSAat our office. Lung cancer screening - not eligible  Flu shotyearly COVID vaccine Pfizer 10/2019, 11/2019, booster 07/2020  Td 6759,1638, Tdap 08/2018 Shingrix - discussed - start today  Seat beltuse discussed. Sunscreen use dicussed. No changing moles on skin. Neversmoker. Chewing tobacco- cutting back. Alcohol -none  Dentist q6 mo  Eye exam yearly   Caffeine: 1 mountain dew/day  Lives with wife, mother in law, 1dog- american bully Occupation: works at [employer]  Activity: no regular exercise, walks dog daily (30 min) - active at work Diet:somewater, vegetables. Likes junk food     Relevant past medical, surgical, family and social history reviewed and updated as indicated. Interim medical history since our last visit reviewed. Allergies and medications reviewed and updated. Outpatient Medications Prior to Visit  Medication Sig Dispense Refill  . fluticasone (FLONASE) 50 MCG/ACT nasal spray SPRAY 2 SPRAYS INTO EACH NOSTRIL EVERY DAY 16 mL 1  .  omeprazole (PRILOSEC OTC) 20 MG tablet Take 20 mg by mouth every morning.     Marland Kitchen allopurinol (ZYLOPRIM) 300 MG tablet TAKE 1/2 TABLET BY MOUTH DAILY 45 tablet 0  . amLODipine (NORVASC) 2.5 MG tablet TAKE 1 TABLET BY MOUTH DAILY. 90 tablet 4   No facility-administered medications prior to visit.     Per HPI unless specifically indicated in ROS section below Review of Systems  Constitutional: Negative for activity change, appetite change, chills, fatigue, fever and unexpected weight change.  HENT: Negative for hearing loss.   Eyes: Negative for visual disturbance.  Respiratory: Negative for cough, chest tightness, shortness of breath and wheezing.   Cardiovascular: Negative for chest pain, palpitations and leg swelling.  Gastrointestinal: Negative for abdominal distention, abdominal pain, blood in stool, constipation, diarrhea, nausea and vomiting.  Genitourinary: Negative for difficulty urinating and hematuria.  Musculoskeletal: Negative for arthralgias, myalgias and neck pain.  Skin: Negative for rash.  Neurological: Negative for dizziness, seizures, syncope and headaches.  Hematological: Negative for adenopathy. Does not bruise/bleed easily.  Psychiatric/Behavioral: Negative for dysphoric mood. The patient is not nervous/anxious.    Objective:  BP 136/84   Pulse 78   Temp (!) 97.5 F (36.4 C) (Temporal)   Ht '5\' 8"'  (1.727 m)   Wt 223 lb 4.8 oz (101.3 kg)   SpO2 95%   BMI 33.95 kg/m   Wt Readings from Last 3 Encounters:  12/19/20 223 lb 4.8 oz (101.3  kg)  09/24/19 202 lb 7 oz (91.8 kg)  11/01/18 209 lb 4 oz (94.9 kg)      Physical Exam Vitals and nursing note reviewed.  Constitutional:      General: He is not in acute distress.    Appearance: Normal appearance. He is well-developed. He is not ill-appearing.  HENT:     Head: Normocephalic and atraumatic.     Right Ear: Hearing, tympanic membrane, ear canal and external ear normal.     Left Ear: Hearing, tympanic membrane,  ear canal and external ear normal.     Mouth/Throat:     Pharynx: Uvula midline.  Eyes:     General: No scleral icterus.    Extraocular Movements: Extraocular movements intact.     Conjunctiva/sclera: Conjunctivae normal.     Pupils: Pupils are equal, round, and reactive to light.  Neck:     Thyroid: No thyroid mass or thyromegaly.  Cardiovascular:     Rate and Rhythm: Normal rate and regular rhythm.     Pulses: Normal pulses.          Radial pulses are 2+ on the right side and 2+ on the left side.     Heart sounds: Normal heart sounds. No murmur heard.   Pulmonary:     Effort: Pulmonary effort is normal. No respiratory distress.     Breath sounds: Normal breath sounds. No wheezing, rhonchi or rales.  Abdominal:     General: Abdomen is flat. Bowel sounds are normal. There is no distension.     Palpations: Abdomen is soft. There is no mass.     Tenderness: There is no abdominal tenderness. There is no guarding or rebound.     Hernia: No hernia is present.  Musculoskeletal:        General: Normal range of motion.     Cervical back: Normal range of motion and neck supple.     Right lower leg: No edema.     Left lower leg: No edema.  Lymphadenopathy:     Cervical: No cervical adenopathy.  Skin:    General: Skin is warm and dry.     Findings: No rash.  Neurological:     General: No focal deficit present.     Mental Status: He is alert and oriented to person, place, and time.     Comments: CN grossly intact, station and gait intact  Psychiatric:        Mood and Affect: Mood normal.        Behavior: Behavior normal.        Thought Content: Thought content normal.        Judgment: Judgment normal.       Results for orders placed or performed in visit on 12/11/20  CBC with Differential/Platelet  Result Value Ref Range   WBC 6.1 4.0 - 10.5 K/uL   RBC 5.42 4.22 - 5.81 Mil/uL   Hemoglobin 16.3 13.0 - 17.0 g/dL   HCT 49.1 39.0 - 52.0 %   MCV 90.5 78.0 - 100.0 fl   MCHC 33.2  30.0 - 36.0 g/dL   RDW 13.6 11.5 - 15.5 %   Platelets 179.0 150.0 - 400.0 K/uL   Neutrophils Relative % 67.2 43.0 - 77.0 %   Lymphocytes Relative 23.5 12.0 - 46.0 %   Monocytes Relative 6.9 3.0 - 12.0 %   Eosinophils Relative 2.0 0.0 - 5.0 %   Basophils Relative 0.4 0.0 - 3.0 %   Neutro Abs 4.1 1.4 -  7.7 K/uL   Lymphs Abs 1.4 0.7 - 4.0 K/uL   Monocytes Absolute 0.4 0.1 - 1.0 K/uL   Eosinophils Absolute 0.1 0.0 - 0.7 K/uL   Basophils Absolute 0.0 0.0 - 0.1 K/uL  Microalbumin / creatinine urine ratio  Result Value Ref Range   Microalb, Ur <0.7 0.0 - 1.9 mg/dL   Creatinine,U 102.4 mg/dL   Microalb Creat Ratio 0.7 0.0 - 30.0 mg/g  Uric acid  Result Value Ref Range   Uric Acid, Serum 4.2 4.0 - 7.8 mg/dL  PSA  Result Value Ref Range   PSA 0.22 0.10 - 4.00 ng/mL  Lipid panel  Result Value Ref Range   Cholesterol 193 0 - 200 mg/dL   Triglycerides 85.0 0.0 - 149.0 mg/dL   HDL 44.20 >39.00 mg/dL   VLDL 17.0 0.0 - 40.0 mg/dL   LDL Cholesterol 132 (H) 0 - 99 mg/dL   Total CHOL/HDL Ratio 4    NonHDL 148.87   Comprehensive metabolic panel  Result Value Ref Range   Sodium 143 135 - 145 mEq/L   Potassium 4.6 3.5 - 5.1 mEq/L   Chloride 105 96 - 112 mEq/L   CO2 31 19 - 32 mEq/L   Glucose, Bld 93 70 - 99 mg/dL   BUN 16 6 - 23 mg/dL   Creatinine, Ser 0.93 0.40 - 1.50 mg/dL   Total Bilirubin 0.5 0.2 - 1.2 mg/dL   Alkaline Phosphatase 77 39 - 117 U/L   AST 17 0 - 37 U/L   ALT 21 0 - 53 U/L   Total Protein 6.9 6.0 - 8.3 g/dL   Albumin 4.3 3.5 - 5.2 g/dL   GFR 88.93 >60.00 mL/min   Calcium 9.0 8.4 - 10.5 mg/dL   Assessment & Plan:  This visit occurred during the SARS-CoV-2 public health emergency.  Safety protocols were in place, including screening questions prior to the visit, additional usage of staff PPE, and extensive cleaning of exam room while observing appropriate contact time as indicated for disinfecting solutions.   Problem List Items Addressed This Visit    HLD  (hyperlipidemia)    Chronic, mild off meds. Discussed ASCVD and statin indication. He declines, will work on increased activity as well as reviewed diet choices to improve LDL cholesterol levels.  The 10-year ASCVD risk score Mikey Bussing [name]., et al., 2013) is: 12.7%   Values used to calculate the score:     Age: 65 years     Sex: Male     Is Non-Hispanic African American: No     Diabetic: No     Tobacco smoker: No     Systolic Blood Pressure: 825 mmHg     Is BP treated: Yes     HDL Cholesterol: 44.2 mg/dL     Total Cholesterol: 193 mg/dL       Relevant Medications   amLODipine (NORVASC) 2.5 MG tablet   Gout    No recent flare, stable on current dose of allopurinol.       Hiatal hernia with GERD    Managed with daily low dose PPI.       Healthcare maintenance    Preventative protocols reviewed and updated unless pt declined. Discussed healthy diet and lifestyle.       Benign prostatic hyperplasia    Stable period.       HTN (hypertension)    Chronic, stable. Continue low dose amlodipine.       Relevant Medications   amLODipine (NORVASC) 2.5 MG tablet  Exposure to silica    Gets yearly CXR through work - overdue for this. Will check with work.        Other Visit Diagnoses    Need for shingles vaccine    -  Primary   Relevant Orders   Varicella-zoster vaccine IM (Completed)       Meds ordered this encounter  Medications  . allopurinol (ZYLOPRIM) 300 MG tablet    Sig: Take 0.5 tablets (150 mg total) by mouth daily.    Dispense:  45 tablet    Refill:  4  . amLODipine (NORVASC) 2.5 MG tablet    Sig: Take 1 tablet (2.5 mg total) by mouth daily.    Dispense:  90 tablet    Refill:  4   Orders Placed This Encounter  Procedures  . Varicella-zoster vaccine IM    Patient instructions: First shingrix vaccine today. Schedule nurse visit in 2-6 months for second and final shingrix vaccine. Good to see you today. You are doing well.  Continue current medicines.   Return as needed or in 1 year for next physical.   Follow up plan: Return in about 1 year (around 12/19/2021) for annual exam, prior fasting for blood work.  Ria Bush, MD

## 2020-12-19 NOTE — Assessment & Plan Note (Signed)
Preventative protocols reviewed and updated unless pt declined. Discussed healthy diet and lifestyle.  

## 2020-12-19 NOTE — Assessment & Plan Note (Signed)
Managed with daily low dose PPI.

## 2020-12-19 NOTE — Assessment & Plan Note (Signed)
Stable period.  

## 2020-12-19 NOTE — Assessment & Plan Note (Signed)
Chronic, stable. Continue low dose amlodipine. 

## 2020-12-19 NOTE — Assessment & Plan Note (Signed)
No recent flare, stable on current dose of allopurinol.

## 2020-12-19 NOTE — Assessment & Plan Note (Addendum)
Chronic, mild off meds. Discussed ASCVD and statin indication. He declines, will work on increased activity as well as reviewed diet choices to improve LDL cholesterol levels.  The 10-year ASCVD risk score Timothy Landry [name]., et al., 2013) is: 12.7%   Values used to calculate the score:     Age: 60 years     Sex: Male     Is Non-Hispanic African American: No     Diabetic: No     Tobacco smoker: No     Systolic Blood Pressure: 341 mmHg     Is BP treated: Yes     HDL Cholesterol: 44.2 mg/dL     Total Cholesterol: 193 mg/dL

## 2021-01-13 ENCOUNTER — Other Ambulatory Visit (HOSPITAL_COMMUNITY): Payer: Self-pay

## 2021-01-16 ENCOUNTER — Other Ambulatory Visit (HOSPITAL_COMMUNITY): Payer: Self-pay

## 2021-01-21 ENCOUNTER — Other Ambulatory Visit (HOSPITAL_COMMUNITY): Payer: Self-pay

## 2021-01-21 ENCOUNTER — Telehealth: Payer: Self-pay

## 2021-01-21 NOTE — Telephone Encounter (Signed)
Noted. Doesn't sound like lyme disease rash.  Does he know how long tick was attached?  Normally want to monitor for symptom development 7-10 days after tick bite (fever, HA, body aches, joint pains, new rash, abd pain or nausea).  Agree with eval if symptoms develop.

## 2021-01-21 NOTE — Telephone Encounter (Signed)
Pt pulled tick off upper rt thigh on 01/18/21;pt is pretty sure that he got the head of the tick but not completely sure; now there is a red circle around where tick was the size of a quarter to a half dollar. Does not look like a bullseye type rash and no drainage; last time seen was last night; pt is presently at work. No fever; but on 01/20/21 pt felt nauseated; no vomiting. Pt has an intermittent h/a but that is not unusual for pt; no loss of appetite, no fatigue,and no muscle aches. Pt wife will ck pts leg where tick bite was located when pt gets home from work and if redness is still there or area of redness is larger or pt has any of above symptoms pt will go to UC for eval especially since not completely sure if got the entire head of tick out. Sending note to Dr Darnell Level and Lattie Haw CMA.

## 2021-01-22 NOTE — Telephone Encounter (Signed)
Lvm asking pt to call back.  Need to get answer to Dr. Synthia Innocent question and relay his message.

## 2021-01-22 NOTE — Telephone Encounter (Signed)
Timothy Landry called in returning phone call and stated that he is fine

## 2021-02-19 ENCOUNTER — Other Ambulatory Visit (HOSPITAL_COMMUNITY): Payer: Self-pay

## 2021-02-20 ENCOUNTER — Telehealth: Payer: Self-pay

## 2021-02-20 NOTE — Telephone Encounter (Signed)
Notified Patient that FMLA paperwork was completed and mailed out as requested. Fax transmission confirmation received from company requested to receive paperwork.

## 2021-04-06 ENCOUNTER — Other Ambulatory Visit (HOSPITAL_COMMUNITY): Payer: Self-pay

## 2021-04-09 ENCOUNTER — Other Ambulatory Visit (HOSPITAL_COMMUNITY): Payer: Self-pay

## 2021-05-21 ENCOUNTER — Other Ambulatory Visit (HOSPITAL_COMMUNITY): Payer: Self-pay

## 2021-06-27 ENCOUNTER — Other Ambulatory Visit (HOSPITAL_COMMUNITY): Payer: Self-pay

## 2021-07-01 ENCOUNTER — Other Ambulatory Visit (HOSPITAL_COMMUNITY): Payer: Self-pay

## 2021-07-02 ENCOUNTER — Other Ambulatory Visit (HOSPITAL_COMMUNITY): Payer: Self-pay

## 2021-07-30 DIAGNOSIS — H5203 Hypermetropia, bilateral: Secondary | ICD-10-CM | POA: Diagnosis not present

## 2021-07-30 DIAGNOSIS — H524 Presbyopia: Secondary | ICD-10-CM | POA: Diagnosis not present

## 2021-07-30 DIAGNOSIS — H52223 Regular astigmatism, bilateral: Secondary | ICD-10-CM | POA: Diagnosis not present

## 2021-08-14 ENCOUNTER — Other Ambulatory Visit (HOSPITAL_COMMUNITY): Payer: Self-pay

## 2021-09-21 ENCOUNTER — Other Ambulatory Visit (HOSPITAL_COMMUNITY): Payer: Self-pay

## 2021-09-22 ENCOUNTER — Other Ambulatory Visit (HOSPITAL_COMMUNITY): Payer: Self-pay

## 2021-11-14 ENCOUNTER — Other Ambulatory Visit (HOSPITAL_COMMUNITY): Payer: Self-pay

## 2021-12-19 ENCOUNTER — Other Ambulatory Visit (HOSPITAL_COMMUNITY): Payer: Self-pay

## 2022-01-09 ENCOUNTER — Other Ambulatory Visit: Payer: Self-pay | Admitting: Family Medicine

## 2022-01-13 ENCOUNTER — Other Ambulatory Visit (HOSPITAL_COMMUNITY): Payer: Self-pay

## 2022-01-13 MED ORDER — AMLODIPINE BESYLATE 2.5 MG PO TABS
2.5000 mg | ORAL_TABLET | Freq: Every day | ORAL | 0 refills | Status: DC
  Filled 2022-01-13 – 2022-01-19 (×2): qty 30, 30d supply, fill #0

## 2022-01-19 ENCOUNTER — Other Ambulatory Visit (HOSPITAL_COMMUNITY): Payer: Self-pay

## 2022-01-21 ENCOUNTER — Other Ambulatory Visit (HOSPITAL_COMMUNITY): Payer: Self-pay

## 2022-01-22 ENCOUNTER — Telehealth: Payer: Self-pay

## 2022-01-22 NOTE — Telephone Encounter (Signed)
If BP was elevated after he was out of med for 5 days, glad he's restarted but would give medication more time to take effect.  If blood pressure stays high after regularly taking 2.5 mg amlodipine, we can discuss increased dose.  We can further review at his appointment next week.

## 2022-01-22 NOTE — Telephone Encounter (Signed)
Spoke with pt relaying Dr. G's message. Pt verbalizes understanding.  

## 2022-01-22 NOTE — Telephone Encounter (Signed)
Received a call from patients wife, Timothy Landry was out of his amlodipine medication for 5 days and just started taking it yesterday. Noticed his blood pressure is reading higher than normal 154/98, not currently symptomatic but wants to know if they should increase the medication for short time to help or just wait until his appointment next Friday to discuss other options.

## 2022-01-29 ENCOUNTER — Other Ambulatory Visit (HOSPITAL_COMMUNITY): Payer: Self-pay

## 2022-01-29 ENCOUNTER — Ambulatory Visit: Payer: 59 | Admitting: Family Medicine

## 2022-01-29 ENCOUNTER — Encounter: Payer: Self-pay | Admitting: Family Medicine

## 2022-01-29 VITALS — BP 158/100 | HR 56 | Temp 97.5°F | Ht 68.0 in | Wt 227.0 lb

## 2022-01-29 DIAGNOSIS — I10 Essential (primary) hypertension: Secondary | ICD-10-CM | POA: Diagnosis not present

## 2022-01-29 DIAGNOSIS — E669 Obesity, unspecified: Secondary | ICD-10-CM

## 2022-01-29 DIAGNOSIS — E66811 Obesity, class 1: Secondary | ICD-10-CM

## 2022-01-29 MED ORDER — ALLOPURINOL 300 MG PO TABS
150.0000 mg | ORAL_TABLET | Freq: Every day | ORAL | 3 refills | Status: DC
  Filled 2022-01-29 – 2022-03-23 (×3): qty 45, 90d supply, fill #0
  Filled 2022-06-11: qty 45, 90d supply, fill #1
  Filled 2022-09-14: qty 15, 30d supply, fill #2
  Filled 2022-10-09: qty 15, 30d supply, fill #3
  Filled 2022-11-09: qty 15, 30d supply, fill #4
  Filled 2022-12-09: qty 15, 30d supply, fill #5
  Filled 2023-01-04: qty 15, 30d supply, fill #6
  Filled 2023-01-25 – 2023-01-29 (×2): qty 15, 30d supply, fill #7

## 2022-01-29 MED ORDER — AMLODIPINE BESYLATE 5 MG PO TABS
5.0000 mg | ORAL_TABLET | Freq: Every day | ORAL | 3 refills | Status: DC
  Filled 2022-01-29 (×2): qty 90, 90d supply, fill #0
  Filled 2022-04-24: qty 90, 90d supply, fill #1
  Filled 2022-07-29: qty 34, 34d supply, fill #2
  Filled 2022-09-03: qty 34, 34d supply, fill #3
  Filled 2022-10-09: qty 34, 34d supply, fill #4
  Filled 2022-11-14: qty 34, 34d supply, fill #5
  Filled 2022-12-16: qty 34, 34d supply, fill #6
  Filled 2023-01-20: qty 10, 10d supply, fill #7

## 2022-01-29 MED ORDER — FLUTICASONE PROPIONATE 50 MCG/ACT NA SUSP
2.0000 | Freq: Every day | NASAL | 6 refills | Status: DC
  Filled 2022-01-29 (×2): qty 16, 30d supply, fill #0
  Filled 2022-11-04: qty 16, 30d supply, fill #1

## 2022-01-29 NOTE — Patient Instructions (Addendum)
Your goal blood pressure is <140/90. Keep an eye on blood pressures at home a few times a week.  Work on low salt/sodium diet - goal <2gm (1,'500mg'$ ) per day. Eat a diet high in fruits/vegetables and whole grains.  Look into mediterranean and DASH diet.  Goal activity is 174mn/wk of moderate intensity exercise.  This can be split into 30 minute chunks.  If you are not at this level, you can start with smaller 10-15 min increments and slowly build up activity. Look at wAhuimanuorg for more resources   DASH Eating Plan DASH stands for Dietary Approaches to Stop Hypertension. The DASH eating plan is a healthy eating plan that has been shown to: Reduce high blood pressure (hypertension). Reduce your risk for type 2 diabetes, heart disease, and stroke. Help with weight loss. What are tips for following this plan? Reading food labels Check food labels for the amount of salt (sodium) per serving. Choose foods with less than 5 percent of the Daily Value of sodium. Generally, foods with less than 300 milligrams (mg) of sodium per serving fit into this eating plan. To find whole grains, look for the word "whole" as the first word in the ingredient list. Shopping Buy products labeled as "low-sodium" or "no salt added." Buy fresh foods. Avoid canned foods and pre-made or frozen meals. Cooking Avoid adding salt when cooking. Use salt-free seasonings or herbs instead of table salt or sea salt. Check with your health care provider or pharmacist before using salt substitutes. Do not fry foods. Cook foods using healthy methods such as baking, boiling, grilling, roasting, and broiling instead. Cook with heart-healthy oils, such as olive, canola, avocado, soybean, or sunflower oil. Meal planning  Eat a balanced diet that includes: 4 or more servings of fruits and 4 or more servings of vegetables each day. Try to fill one-half of your plate with fruits and vegetables. 6-8 servings of whole grains each  day. Less than 6 oz (170 g) of lean meat, poultry, or fish each day. A 3-oz (85-g) serving of meat is about the same size as a deck of cards. One egg equals 1 oz (28 g). 2-3 servings of low-fat dairy each day. One serving is 1 cup (237 mL). 1 serving of nuts, seeds, or beans 5 times each week. 2-3 servings of heart-healthy fats. Healthy fats called omega-3 fatty acids are found in foods such as walnuts, flaxseeds, fortified milks, and eggs. These fats are also found in cold-water fish, such as sardines, salmon, and mackerel. Limit how much you eat of: Canned or prepackaged foods. Food that is high in trans fat, such as some fried foods. Food that is high in saturated fat, such as fatty meat. Desserts and other sweets, sugary drinks, and other foods with added sugar. Full-fat dairy products. Do not salt foods before eating. Do not eat more than 4 egg yolks a week. Try to eat at least 2 vegetarian meals a week. Eat more home-cooked food and less restaurant, buffet, and fast food. Lifestyle When eating at a restaurant, ask that your food be prepared with less salt or no salt, if possible. If you drink alcohol: Limit how much you use to: 0-1 drink a day for women who are not pregnant. 0-2 drinks a day for men. Be aware of how much alcohol is in your drink. In the [place] one drink equals one 12 oz bottle of beer (355 mL), one 5 oz glass of wine (148 mL), or one 1 oz glass of  hard liquor (44 mL). General information Avoid eating more than 2,300 mg of salt a day. If you have hypertension, you may need to reduce your sodium intake to 1,500 mg a day. Work with your health care provider to maintain a healthy body weight or to lose weight. Ask what an ideal weight is for you. Get at least 30 minutes of exercise that causes your heart to beat faster (aerobic exercise) most days of the week. Activities may include walking, swimming, or biking. Work with your health care provider or dietitian to adjust  your eating plan to your individual calorie needs. What foods should I eat? Fruits All fresh, dried, or frozen fruit. Canned fruit in natural juice (without added sugar). Vegetables Fresh or frozen vegetables (raw, steamed, roasted, or grilled). Low-sodium or reduced-sodium tomato and vegetable juice. Low-sodium or reduced-sodium tomato sauce and tomato paste. Low-sodium or reduced-sodium canned vegetables. Grains Whole-grain or whole-wheat bread. Whole-grain or whole-wheat pasta. Brown rice. [place]. Bulgur. Whole-grain and low-sodium cereals. Pita bread. Low-fat, low-sodium crackers. Whole-wheat flour tortillas. Meats and other proteins Skinless chicken or [place]. Ground chicken or [place]. Pork with fat trimmed off. Fish and seafood. Egg whites. Dried beans, peas, or lentils. Unsalted nuts, nut butters, and seeds. Unsalted canned beans. Lean cuts of beef with fat trimmed off. Low-sodium, lean precooked or cured meat, such as sausages or meat loaves. Dairy Low-fat (1%) or fat-free (skim) milk. Reduced-fat, low-fat, or fat-free cheeses. Nonfat, low-sodium ricotta or cottage cheese. Low-fat or nonfat yogurt. Low-fat, low-sodium cheese. Fats and oils Soft margarine without trans fats. Vegetable oil. Reduced-fat, low-fat, or light mayonnaise and salad dressings (reduced-sodium). Canola, safflower, olive, avocado, soybean, and sunflower oils. Avocado. Seasonings and condiments Herbs. Spices. Seasoning mixes without salt. Other foods Unsalted popcorn and pretzels. Fat-free sweets. The items listed above may not be a complete list of foods and beverages you can eat. Contact a dietitian for more information. What foods should I avoid? Fruits Canned fruit in a light or heavy syrup. Fried fruit. Fruit in cream or butter sauce. Vegetables Creamed or fried vegetables. Vegetables in a cheese sauce. Regular canned vegetables (not low-sodium or reduced-sodium). Regular canned tomato sauce and paste  (not low-sodium or reduced-sodium). Regular tomato and vegetable juice (not low-sodium or reduced-sodium). Angie Fava. Olives. Grains Baked goods made with fat, such as croissants, muffins, or some breads. Dry pasta or rice meal packs. Meats and other proteins Fatty cuts of meat. Ribs. Fried meat. Berniece Salines. Bologna, salami, and other precooked or cured meats, such as sausages or meat loaves. Fat from the back of a pig (fatback). Bratwurst. Salted nuts and seeds. Canned beans with added salt. Canned or smoked fish. Whole eggs or egg yolks. Chicken or [place] with skin. Dairy Whole or 2% milk, cream, and half-and-half. Whole or full-fat cream cheese. Whole-fat or sweetened yogurt. Full-fat cheese. Nondairy creamers. Whipped toppings. Processed cheese and cheese spreads. Fats and oils Butter. Stick margarine. Lard. Shortening. Ghee. Bacon fat. Tropical oils, such as coconut, palm kernel, or palm oil. Seasonings and condiments Onion salt, garlic salt, seasoned salt, table salt, and sea salt. Worcestershire sauce. Tartar sauce. Barbecue sauce. Teriyaki sauce. Soy sauce, including reduced-sodium. Steak sauce. Canned and packaged gravies. Fish sauce. Oyster sauce. Cocktail sauce. Store-bought horseradish. Ketchup. Mustard. Meat flavorings and tenderizers. Bouillon cubes. Hot sauces. Pre-made or packaged marinades. Pre-made or packaged taco seasonings. Relishes. Regular salad dressings. Other foods Salted popcorn and pretzels. The items listed above may not be a complete list of foods and beverages you  should avoid. Contact a dietitian for more information. Where to find more information National Heart, Lung, and Blood Institute: https://wilson-eaton.com/ American Heart Association: www.heart.org Academy of Nutrition and Dietetics: www.eatright.[place]: www.kidney.org Summary The DASH eating plan is a healthy eating plan that has been shown to reduce high blood pressure (hypertension). It may  also reduce your risk for type 2 diabetes, heart disease, and stroke. When on the DASH eating plan, aim to eat more fresh fruits and vegetables, whole grains, lean proteins, low-fat dairy, and heart-healthy fats. With the DASH eating plan, you should limit salt (sodium) intake to 2,300 mg a day. If you have hypertension, you may need to reduce your sodium intake to 1,500 mg a day. Work with your health care provider or dietitian to adjust your eating plan to your individual calorie needs. This information is not intended to replace advice given to you by your health care provider. Make sure you discuss any questions you have with your health care provider. Document Revised: 07/06/2019 Document Reviewed: 07/06/2019 Elsevier Patient Education  [place].

## 2022-01-29 NOTE — Assessment & Plan Note (Signed)
Discussed weight gain noted, patient motivated to work on healthy diet and lifestyle changes to affect sustainable weight loss.

## 2022-01-29 NOTE — Progress Notes (Addendum)
Patient ID: Timothy Landry, male    DOB: 1960/11/07, 61 y.o.   MRN: [id number]  This visit was conducted in person.  BP (!) 158/100 (BP Location: Right Arm, Cuff Size: Large)   Pulse (!) 56   Temp (!) 97.5 F (36.4 C) (Temporal)   Ht '5\' 8"'$  (1.727 m)   Wt 227 lb (103 kg)   SpO2 95%   BMI 34.52 kg/m   Elevated on repeat  CC: HTN f/u visit  Subjective:   HPI: Timothy Landry is a 61 y.o. male presenting on 01/29/2022 for Hypertension (Here for f/u.  States he was out of BP meds 5-6 days. )   Last seen 12/2020.  Retired 11/2021.  25 lb weight gain in past 2 yrs. He recently started regularly - twice weekly.   HTN - Compliant with current antihypertensive regimen of amlodipine 2.'5mg'$  daily - although ran out for 5 days, but has since restarted in the past week. Limits salt at home. Does check blood pressures at home: 150/100s.  No low blood pressure readings or symptoms of dizziness/syncope. Denies HA, vision changes, CP/tightness, SOB, leg swelling.      Relevant past medical, surgical, family and social history reviewed and updated as indicated. Interim medical history since our last visit reviewed. Allergies and medications reviewed and updated. Outpatient Medications Prior to Visit  Medication Sig Dispense Refill   omeprazole (PRILOSEC OTC) 20 MG tablet Take 20 mg by mouth every morning.      allopurinol (ZYLOPRIM) 300 MG tablet Take 1/2 tablet (150 mg total) by mouth daily. 45 tablet 4   amLODipine (NORVASC) 2.5 MG tablet Take 1 tablet (2.5 mg total) by mouth daily. NEEDS TO SCHEDULE PHYSICAL 30 tablet 0   fluticasone (FLONASE) 50 MCG/ACT nasal spray SPRAY 2 SPRAYS INTO EACH NOSTRIL EVERY DAY 16 mL 1   No facility-administered medications prior to visit.     Per HPI unless specifically indicated in ROS section below Review of Systems  Objective:  BP (!) 158/100 (BP Location: Right Arm, Cuff Size: Large)   Pulse (!) 56   Temp (!) 97.5 F (36.4 C) (Temporal)   Ht '5\' 8"'$   (1.727 m)   Wt 227 lb (103 kg)   SpO2 95%   BMI 34.52 kg/m   Wt Readings from Last 3 Encounters:  01/29/22 227 lb (103 kg)  12/19/20 223 lb 4.8 oz (101.3 kg)  09/24/19 202 lb 7 oz (91.8 kg)      Physical Exam Vitals and nursing note reviewed.  Constitutional:      Appearance: Normal appearance. He is not ill-appearing.  Cardiovascular:     Rate and Rhythm: Normal rate and regular rhythm.     Pulses: Normal pulses.     Heart sounds: Normal heart sounds. No murmur heard. Pulmonary:     Effort: Pulmonary effort is normal. No respiratory distress.     Breath sounds: Normal breath sounds. No wheezing, rhonchi or rales.  Musculoskeletal:     Right lower leg: No edema.     Left lower leg: No edema.  Skin:    General: Skin is warm and dry.     Findings: No rash.  Neurological:     Mental Status: He is alert.  Psychiatric:        Mood and Affect: Mood normal.        Behavior: Behavior normal.        Assessment & Plan:   Problem List Items Addressed This Visit  HTN (hypertension) - Primary    Chronic, deteriorated control - will increase amlodipine to '5mg'$  daily, RTC 6 wks CPE recheck at that time.  Reviewed lifestyle changes to affect improved BP control including DASH diet (handout provided) and increased aerobic exercise, as well as limiting salt/sodium in the diet. Suspect may need additional medication to reach blood pressure goals, will start with increased amlodipine.      Relevant Medications   amLODipine (NORVASC) 5 MG tablet   Obesity, Class I, BMI 30-34.9    Discussed weight gain noted, patient motivated to work on healthy diet and lifestyle changes to affect sustainable weight loss.        Meds ordered this encounter  Medications   allopurinol (ZYLOPRIM) 300 MG tablet    Sig: Take 0.5 tablets (150 mg total) by mouth daily.    Dispense:  45 tablet    Refill:  3   amLODipine (NORVASC) 5 MG tablet    Sig: Take 1 tablet (5 mg total) by mouth daily.     Dispense:  90 tablet    Refill:  3   fluticasone (FLONASE) 50 MCG/ACT nasal spray    Sig: Place 2 sprays into both nostrils daily.    Dispense:  16 mL    Refill:  6    NEW RX   No orders of the defined types were placed in this encounter.    Patient instructions: Your goal blood pressure is <140/90. Keep an eye on blood pressures at home a few times a week.  Work on low salt/sodium diet - goal <2gm (1,'500mg'$ ) per day. Eat a diet high in fruits/vegetables and whole grains.  Look into mediterranean and DASH diet.  Goal activity is 125mn/wk of moderate intensity exercise.  This can be split into 30 minute chunks.  If you are not at this level, you can start with smaller 10-15 min increments and slowly build up activity. Look at [place]org for more resources   Follow up plan: Return in about 6 weeks (around 03/12/2022) for annual exam, prior fasting for blood work.  JRia Bush MD

## 2022-01-29 NOTE — Assessment & Plan Note (Addendum)
Chronic, deteriorated control - will increase amlodipine to '5mg'$  daily, RTC 6 wks CPE recheck at that time.  Reviewed lifestyle changes to affect improved BP control including DASH diet (handout provided) and increased aerobic exercise, as well as limiting salt/sodium in the diet. Suspect may need additional medication to reach blood pressure goals, will start with increased amlodipine.

## 2022-03-24 ENCOUNTER — Other Ambulatory Visit (HOSPITAL_COMMUNITY): Payer: Self-pay

## 2022-04-26 ENCOUNTER — Other Ambulatory Visit (HOSPITAL_COMMUNITY): Payer: Self-pay

## 2022-06-11 ENCOUNTER — Other Ambulatory Visit (HOSPITAL_COMMUNITY): Payer: Self-pay

## 2022-06-14 ENCOUNTER — Other Ambulatory Visit (HOSPITAL_COMMUNITY): Payer: Self-pay

## 2022-06-15 ENCOUNTER — Other Ambulatory Visit (HOSPITAL_COMMUNITY): Payer: Self-pay

## 2022-07-23 ENCOUNTER — Ambulatory Visit (INDEPENDENT_AMBULATORY_CARE_PROVIDER_SITE_OTHER): Payer: PRIVATE HEALTH INSURANCE

## 2022-07-23 ENCOUNTER — Ambulatory Visit (INDEPENDENT_AMBULATORY_CARE_PROVIDER_SITE_OTHER): Payer: No Typology Code available for payment source | Admitting: Podiatry

## 2022-07-23 DIAGNOSIS — M7672 Peroneal tendinitis, left leg: Secondary | ICD-10-CM

## 2022-07-23 MED ORDER — METHYLPREDNISOLONE 4 MG PO TBPK: ORAL_TABLET | ORAL | 0 refills | Status: DC

## 2022-07-23 MED ORDER — MELOXICAM 15 MG PO TABS: 15.0000 mg | ORAL_TABLET | Freq: Every day | ORAL | 1 refills | Status: DC

## 2022-07-23 MED ORDER — BETAMETHASONE SOD PHOS & ACET 6 (3-3) MG/ML IJ SUSP: 3.0000 mg | Freq: Once | INTRAMUSCULAR | Status: DC

## 2022-07-23 MED ORDER — BETAMETHASONE SOD PHOS & ACET 6 (3-3) MG/ML IJ SUSP
3.0000 mg | Freq: Once | INTRAMUSCULAR | Status: AC
  Administered 2022-07-23: 3 mg via INTRA_ARTICULAR

## 2022-07-23 NOTE — Progress Notes (Signed)
   Chief Complaint  Patient presents with   Foot Pain    Patient is here for left foot pain.    HPI: 61 y.o. male presenting today as a new patient for evaluation of pain and tenderness to the lateral aspect of the left foot this been going on for a few months now.  Denies a history of injury.  He is not anything currently for treatment.  Past Medical History:  Diagnosis Date   BPH (benign prostatic hypertrophy) 2016   s/p photovaporization Yves Dill)   Diverticulosis 04/2012   GERD (gastroesophageal reflux disease)    Gout 08/1996   History of hiatal hernia 08/1996   Barium Swallow HH, Mod . Size   Hyperlipidemia 12/1998   Nephrolithiasis 2014   stent placed, then removed   Self-catheterizes urinary bladder    2-3 times/day    Past Surgical History:  Procedure Laterality Date   COLONOSCOPY  04/2012   mod diverticulosis, 3 polyps - benign, rec rpt 10 yrs (Pyrtle)   CYSTECTOMY  1994   cystectomy right foot and gluteal area   CYSTOSCOPY WITH LITHOLAPAXY  06/03/2015   Procedure: CYSTOSCOPY WITH LITHOLAPAXY WITH HOLMIUM LASER ;  Surgeon: Royston Cowper, MD;  Location: [facility] ORS;  Service: Urology;;   GREEN LIGHT LASER TURP (TRANSURETHRAL RESECTION OF PROSTATE N/A 06/03/2015   Procedure: GREEN LIGHT LASER TURP (TRANSURETHRAL RESECTION OF PROSTATE;  Surgeon: Royston Cowper, MD;  Location: [facility] ORS;  Service: Urology;  Laterality: N/A;   LITHOTRIPSY  1999   LITHOTRIPSY  2014   with stent placement, s/p removal Yves Dill)   stress echocardiogram  05/2015   overall normal after max exercise, severe hypertensive response, EF 60-65%   WRIST GANGLION EXCISION      No Known Allergies   Physical Exam: General: The patient is alert and oriented x3 in no acute distress.  Dermatology: Skin is warm, dry and supple bilateral lower extremities. Negative for open lesions or macerations.  Vascular: Palpable pedal pulses bilaterally. Capillary refill within normal limits.  Negative for any  significant edema or erythema  Neurological: Light touch and protective threshold grossly intact  Musculoskeletal Exam: No pedal deformities noted.  There is pain on palpation at the fifth metatarsal tubercle at the insertion of the peroneal tendon consistent with insertional peroneal tendinitis  Radiographic Exam LT foot 07/23/2022:  Normal osseous mineralization. Joint spaces preserved. No fracture/dislocation/boney destruction.    Assessment: 1.  Insertional peroneal tendinitis left   Plan of Care:  1. Patient evaluated. X-Rays reviewed.  2.  Injection of 0.5 cc Celestone Soluspan injected around the fifth metatarsal tubercle left 3.  Prescription for Medrol Dosepak 4.  Prescription for meloxicam 15 mg daily 5.  Recommend wide fitting shoes that do not irritate the lateral aspect of the foot 6.  Return to clinic as needed      Edrick Kins, DPM [facility]  Dr. Edrick Kins, DPM    [address], [place] [address]                Office [phone]  Fax [phone]

## 2022-07-29 ENCOUNTER — Other Ambulatory Visit (HOSPITAL_COMMUNITY): Payer: Self-pay

## 2022-07-30 ENCOUNTER — Other Ambulatory Visit: Payer: Self-pay

## 2022-08-24 ENCOUNTER — Ambulatory Visit (INDEPENDENT_AMBULATORY_CARE_PROVIDER_SITE_OTHER): Payer: No Typology Code available for payment source | Admitting: Podiatry

## 2022-08-24 DIAGNOSIS — M7672 Peroneal tendinitis, left leg: Secondary | ICD-10-CM | POA: Diagnosis not present

## 2022-08-24 NOTE — Progress Notes (Signed)
   Chief Complaint  Patient presents with   Foot Pain    Left foot pain, patient denies any pain, patient is doing better     HPI: 61 y.o. male presenting today for follow-up evaluation of pain and tenderness to the lateral aspect of the left foot this been going on for a few months now.  Patient states that the injection and Medrol Dosepak helped significantly reduce his pain and tenderness.  He no longer has any pain or tenderness to the lateral aspect of the foot.  Doing well.  He continues to take the meloxicam 15 mg daily.  No new complaints at this time  Past Medical History:  Diagnosis Date   BPH (benign prostatic hypertrophy) 2016   s/p photovaporization Yves Dill)   Diverticulosis 04/2012   GERD (gastroesophageal reflux disease)    Gout 08/1996   History of hiatal hernia 08/1996   Barium Swallow HH, Mod . Size   Hyperlipidemia 12/1998   Nephrolithiasis 2014   stent placed, then removed   Self-catheterizes urinary bladder    2-3 times/day    Past Surgical History:  Procedure Laterality Date   COLONOSCOPY  04/2012   mod diverticulosis, 3 polyps - benign, rec rpt 10 yrs (Pyrtle)   CYSTECTOMY  1994   cystectomy right foot and gluteal area   CYSTOSCOPY WITH LITHOLAPAXY  06/03/2015   Procedure: CYSTOSCOPY WITH LITHOLAPAXY WITH HOLMIUM LASER ;  Surgeon: Royston Cowper, MD;  Location: [facility] ORS;  Service: Urology;;   GREEN LIGHT LASER TURP (TRANSURETHRAL RESECTION OF PROSTATE N/A 06/03/2015   Procedure: GREEN LIGHT LASER TURP (TRANSURETHRAL RESECTION OF PROSTATE;  Surgeon: Royston Cowper, MD;  Location: [facility] ORS;  Service: Urology;  Laterality: N/A;   LITHOTRIPSY  1999   LITHOTRIPSY  2014   with stent placement, s/p removal Yves Dill)   stress echocardiogram  05/2015   overall normal after max exercise, severe hypertensive response, EF 60-65%   WRIST GANGLION EXCISION      No Known Allergies   Physical Exam: General: The patient is alert and oriented x3 in no acute  distress.  Dermatology: Skin is warm, dry and supple bilateral lower extremities. Negative for open lesions or macerations.  Vascular: Palpable pedal pulses bilaterally. Capillary refill within normal limits.  Negative for any significant edema or erythema  Neurological: Light touch and protective threshold grossly intact  Musculoskeletal Exam: No pedal deformities noted.  There is pain on palpation at the fifth metatarsal tubercle at the insertion of the peroneal tendon consistent with insertional peroneal tendinitis  Radiographic Exam LT foot 07/23/2022:  Normal osseous mineralization. Joint spaces preserved. No fracture/dislocation/boney destruction.    Assessment: 1.  Insertional peroneal tendinitis left; resolved -Patient evaluated -Continue wearing good supportive shoes and sneakers -Continue meloxicam 15 mg daily as needed -Return to clinic as needed     Edrick Kins, DPM [facility]  Dr. Edrick Kins, DPM    [address], [place] [address]                Office [phone]  Fax [phone]

## 2022-09-03 ENCOUNTER — Other Ambulatory Visit (HOSPITAL_COMMUNITY): Payer: Self-pay

## 2022-09-15 ENCOUNTER — Other Ambulatory Visit (HOSPITAL_COMMUNITY): Payer: Self-pay

## 2022-09-24 ENCOUNTER — Encounter: Payer: Self-pay | Admitting: Internal Medicine

## 2022-10-09 ENCOUNTER — Other Ambulatory Visit (HOSPITAL_COMMUNITY): Payer: Self-pay

## 2022-10-29 ENCOUNTER — Other Ambulatory Visit: Payer: Self-pay | Admitting: Podiatry

## 2022-11-04 ENCOUNTER — Other Ambulatory Visit (HOSPITAL_COMMUNITY): Payer: Self-pay

## 2022-11-09 ENCOUNTER — Other Ambulatory Visit: Payer: Self-pay

## 2022-11-14 ENCOUNTER — Other Ambulatory Visit (HOSPITAL_COMMUNITY): Payer: Self-pay

## 2022-11-15 ENCOUNTER — Other Ambulatory Visit (HOSPITAL_COMMUNITY): Payer: Self-pay

## 2022-12-10 ENCOUNTER — Other Ambulatory Visit (HOSPITAL_COMMUNITY): Payer: Self-pay

## 2022-12-17 ENCOUNTER — Other Ambulatory Visit (HOSPITAL_COMMUNITY): Payer: Self-pay

## 2022-12-23 ENCOUNTER — Other Ambulatory Visit: Payer: Self-pay | Admitting: Podiatry

## 2023-01-04 ENCOUNTER — Other Ambulatory Visit (HOSPITAL_COMMUNITY): Payer: Self-pay

## 2023-01-20 ENCOUNTER — Other Ambulatory Visit: Payer: Self-pay

## 2023-01-25 ENCOUNTER — Other Ambulatory Visit: Payer: Self-pay

## 2023-01-29 ENCOUNTER — Other Ambulatory Visit (HOSPITAL_COMMUNITY): Payer: Self-pay

## 2023-01-31 ENCOUNTER — Other Ambulatory Visit: Payer: Self-pay

## 2023-01-31 ENCOUNTER — Telehealth: Payer: Self-pay | Admitting: Family Medicine

## 2023-01-31 DIAGNOSIS — I10 Essential (primary) hypertension: Secondary | ICD-10-CM

## 2023-01-31 MED ORDER — AMLODIPINE BESYLATE 5 MG PO TABS: 5.0000 mg | ORAL_TABLET | Freq: Every day | ORAL | 0 refills | Status: DC

## 2023-01-31 NOTE — Telephone Encounter (Signed)
Prescription Request  01/31/2023  LOV: Visit date not found  What is the name of the medication or equipment? amLODipine (NORVASC) 5 MG tablet  Have you contacted your pharmacy to request a refill? No   Which pharmacy would you like this sent to?  [id number] [place], [place] - [address] AT [facility] [address] [place] [address] Phone: [phone] Fax: [phone]    Patient notified that their request is being sent to the clinical staff for review and that they should receive a response within 2 business days.   Please advise at [facility] [phone]

## 2023-01-31 NOTE — Telephone Encounter (Signed)
E-scribed refill 

## 2023-01-31 NOTE — Addendum Note (Signed)
Addended by: Nanci Pina on: 01/31/2023 03:27 PM   Modules accepted: Orders

## 2023-02-10 ENCOUNTER — Other Ambulatory Visit: Payer: Self-pay | Admitting: Family Medicine

## 2023-02-10 ENCOUNTER — Other Ambulatory Visit: Payer: Self-pay

## 2023-02-10 ENCOUNTER — Other Ambulatory Visit (HOSPITAL_COMMUNITY): Payer: Self-pay

## 2023-02-10 DIAGNOSIS — M1A09X0 Idiopathic chronic gout, multiple sites, without tophus (tophi): Secondary | ICD-10-CM

## 2023-02-10 MED ORDER — ALLOPURINOL 300 MG PO TABS
150.0000 mg | ORAL_TABLET | Freq: Every day | ORAL | 0 refills | Status: DC
  Filled 2023-02-10: qty 15, 30d supply, fill #0

## 2023-02-11 ENCOUNTER — Other Ambulatory Visit (HOSPITAL_COMMUNITY): Payer: Self-pay

## 2023-02-12 ENCOUNTER — Other Ambulatory Visit: Payer: Self-pay | Admitting: Family Medicine

## 2023-02-12 DIAGNOSIS — N400 Enlarged prostate without lower urinary tract symptoms: Secondary | ICD-10-CM

## 2023-02-12 DIAGNOSIS — I10 Essential (primary) hypertension: Secondary | ICD-10-CM

## 2023-02-12 DIAGNOSIS — E7800 Pure hypercholesterolemia, unspecified: Secondary | ICD-10-CM

## 2023-02-12 DIAGNOSIS — M1A09X0 Idiopathic chronic gout, multiple sites, without tophus (tophi): Secondary | ICD-10-CM

## 2023-02-18 ENCOUNTER — Other Ambulatory Visit (INDEPENDENT_AMBULATORY_CARE_PROVIDER_SITE_OTHER): Payer: No Typology Code available for payment source

## 2023-02-18 DIAGNOSIS — E7800 Pure hypercholesterolemia, unspecified: Secondary | ICD-10-CM

## 2023-02-18 DIAGNOSIS — I10 Essential (primary) hypertension: Secondary | ICD-10-CM | POA: Diagnosis not present

## 2023-02-18 DIAGNOSIS — N400 Enlarged prostate without lower urinary tract symptoms: Secondary | ICD-10-CM

## 2023-02-18 DIAGNOSIS — M1A09X0 Idiopathic chronic gout, multiple sites, without tophus (tophi): Secondary | ICD-10-CM | POA: Diagnosis not present

## 2023-02-18 DIAGNOSIS — R55 Syncope and collapse: Secondary | ICD-10-CM

## 2023-02-18 LAB — COMPREHENSIVE METABOLIC PANEL
ALT: 27 U/L (ref 0–53)
AST: 19 U/L (ref 0–37)
Albumin: 4.3 g/dL (ref 3.5–5.2)
Alkaline Phosphatase: 79 U/L (ref 39–117)
BUN: 16 mg/dL (ref 6–23)
CO2: 30 mEq/L (ref 19–32)
Calcium: 9.3 mg/dL (ref 8.4–10.5)
Chloride: 104 mEq/L (ref 96–112)
Creatinine, Ser: 1.01 mg/dL (ref 0.40–1.50)
GFR: 79.32 mL/min (ref >60.00)
Glucose, Bld: 98 mg/dL (ref 70–99)
Potassium: 4.3 mEq/L (ref 3.5–5.1)
Sodium: 142 mEq/L (ref 135–145)
Total Bilirubin: 0.6 mg/dL (ref 0.2–1.2)
Total Protein: 6.8 g/dL (ref 6.0–8.3)

## 2023-02-18 LAB — MICROALBUMIN / CREATININE URINE RATIO
Creatinine,U: 352.6 mg/dL
Microalb Creat Ratio: 0.3 mg/g (ref 0.0–30.0)
Microalb, Ur: 1.2 mg/dL (ref 0.0–1.9)

## 2023-02-18 LAB — LIPID PANEL
Cholesterol: 184 mg/dL (ref 0–200)
HDL: 34.8 mg/dL — ABNORMAL LOW (ref >39.00)
LDL Cholesterol: 124 mg/dL — ABNORMAL HIGH (ref 0–99)
NonHDL: 148.96
Total CHOL/HDL Ratio: 5
Triglycerides: 125 mg/dL (ref 0.0–149.0)
VLDL: 25 mg/dL (ref 0.0–40.0)

## 2023-02-18 LAB — PSA: PSA: 0.29 ng/mL (ref 0.10–4.00)

## 2023-02-18 LAB — URIC ACID: Uric Acid, Serum: 4.9 mg/dL (ref 4.0–7.8)

## 2023-02-22 ENCOUNTER — Other Ambulatory Visit: Payer: Self-pay | Admitting: Podiatry

## 2023-02-25 ENCOUNTER — Encounter: Payer: Self-pay | Admitting: Family Medicine

## 2023-02-25 ENCOUNTER — Ambulatory Visit (INDEPENDENT_AMBULATORY_CARE_PROVIDER_SITE_OTHER)
Admission: RE | Admit: 2023-02-25 | Discharge: 2023-02-25 | Disposition: A | Discharge status: Home / Self Care | Payer: No Typology Code available for payment source | Source: Ambulatory Visit | Attending: Family Medicine | Admitting: Family Medicine

## 2023-02-25 ENCOUNTER — Ambulatory Visit (INDEPENDENT_AMBULATORY_CARE_PROVIDER_SITE_OTHER): Payer: No Typology Code available for payment source | Admitting: Family Medicine

## 2023-02-25 VITALS — BP 150/92 | HR 65 | Temp 97.7°F | Ht 67.5 in | Wt 235.0 lb

## 2023-02-25 DIAGNOSIS — Z7729 Contact with and (suspected ) exposure to other hazardous substances: Secondary | ICD-10-CM

## 2023-02-25 DIAGNOSIS — Z23 Encounter for immunization: Secondary | ICD-10-CM | POA: Diagnosis not present

## 2023-02-25 DIAGNOSIS — N400 Enlarged prostate without lower urinary tract symptoms: Secondary | ICD-10-CM

## 2023-02-25 DIAGNOSIS — M1A09X0 Idiopathic chronic gout, multiple sites, without tophus (tophi): Secondary | ICD-10-CM

## 2023-02-25 DIAGNOSIS — E7800 Pure hypercholesterolemia, unspecified: Secondary | ICD-10-CM

## 2023-02-25 DIAGNOSIS — I10 Essential (primary) hypertension: Secondary | ICD-10-CM

## 2023-02-25 DIAGNOSIS — Z0000 Encounter for general adult medical examination without abnormal findings: Secondary | ICD-10-CM

## 2023-02-25 MED ORDER — LOSARTAN POTASSIUM 25 MG PO TABS: 25.0000 mg | ORAL_TABLET | Freq: Every day | ORAL | 4 refills | Status: DC

## 2023-02-25 MED ORDER — ALLOPURINOL 300 MG PO TABS: 150.0000 mg | ORAL_TABLET | Freq: Every day | ORAL | 4 refills | Status: DC

## 2023-02-25 MED ORDER — AMLODIPINE BESYLATE 5 MG PO TABS
5.0000 mg | ORAL_TABLET | Freq: Every day | ORAL | 4 refills | Status: DC
Start: 2023-02-25 — End: 2024-02-22

## 2023-02-25 NOTE — Progress Notes (Signed)
cxr Ph: [phone] Fax: [phone]   Patient ID: Timothy Landry, male    DOB: 04/04/1961, 62 y.o.   MRN: [id number]  This visit was conducted in person.  BP (!) 150/92 (BP Location: Right Arm, Cuff Size: Large)   Pulse 65   Temp 97.7 F (36.5 C) (Temporal)   Ht 5' 7.5" (1.715 m)   Wt 235 lb (106.6 kg)   SpO2 98%   BMI 36.26 kg/m   BP Readings from Last 3 Encounters:  02/25/23 (!) 150/92  01/29/22 (!) 158/100  12/19/20 136/84   CC: CPE Subjective:   HPI: Timothy Landry is a 62 y.o. male presenting on 02/25/2023 for Annual Exam   Retired from [employer] ([employer] [place]) - was around silica dust 39 yrs exposure - received chest xray once a year at work - overdue for this. He does use respirator as well. Planning to retire next year 2023.   Wife has melanoma with met to brain/lung was on [place] - had brain surgery 2021.   HTN - continues amlodipine 5mg  daily. BP at home has been elevated. Slight headache occasionally.   Taking meloxicam for foot tendonitis.    Preventative: COLONOSCOPY 04/2012 mod diverticulosis, 3 polyps - benign, rec rpt 10 yrs (Pyrtle)  Planning to reschedule.  Prostate cancer screening - s/p green laser TURP by Dr Evelene Croon 2016 - released from urology care. Yearly PSA at our office.  Lung cancer screening - not eligible  Flu shot yearly  COVID vaccine Pfizer 10/2019, 11/2019, booster 07/2020  Td 1999, 2009, Tdap 08/2018 Shingrix - 12/2020, due for 2nd Seat belt use discussed.  Sunscreen use dicussed. No changing moles on skin.  Never smoker. Chewing tobacco - cutting back.  Alcohol - rare   Dentist q6 mo - needs to find new dentist  Eye exam yearly    Caffeine: 1 mountain dew/day  Lives with wife, mother in law, 1 dog - american bully Occupation: works at [employer]  Activity: no regular exercise, walks dog daily (30 min) - active at work Diet: some water, vegetables. Likes junk food     Relevant past medical, surgical, family and social  history reviewed and updated as indicated. Interim medical history since our last visit reviewed. Allergies and medications reviewed and updated. Outpatient Medications Prior to Visit  Medication Sig Dispense Refill   fluticasone (FLONASE) 50 MCG/ACT nasal spray Place 2 sprays into both nostrils daily. 16 g 6   meloxicam (MOBIC) 15 MG tablet TAKE 1 TABLET (15 MG TOTAL) BY MOUTH DAILY. 30 tablet 1   omeprazole (PRILOSEC OTC) 20 MG tablet Take 20 mg by mouth every morning.      allopurinol (ZYLOPRIM) 300 MG tablet Take 0.5 tablets (150 mg total) by mouth daily. 45 tablet 0   amLODipine (NORVASC) 5 MG tablet Take 1 tablet (5 mg total) by mouth daily. 90 tablet 0   methylPREDNISolone (MEDROL DOSEPAK) 4 MG TBPK tablet 6 day dose pack - take as directed 21 tablet 0   No facility-administered medications prior to visit.     Per HPI unless specifically indicated in ROS section below Review of Systems  Constitutional:  Negative for activity change, appetite change, chills, fatigue, fever and unexpected weight change.  HENT:  Negative for hearing loss.   Eyes:  Negative for visual disturbance.  Respiratory:  Negative for cough, chest tightness, shortness of breath and wheezing.   Cardiovascular:  Negative for chest pain, palpitations and leg swelling.  Gastrointestinal:  Negative for abdominal distention, abdominal pain, blood in stool, constipation, diarrhea, nausea and vomiting.  Genitourinary:  Negative for difficulty urinating and hematuria.  Musculoskeletal:  Negative for arthralgias, myalgias and neck pain.  Skin:  Negative for rash.  Neurological:  Negative for dizziness, seizures, syncope and headaches.  Hematological:  Negative for adenopathy. Does not bruise/bleed easily.  Psychiatric/Behavioral:  Negative for dysphoric mood. The patient is not nervous/anxious.     Objective:  BP (!) 150/92 (BP Location: Right Arm, Cuff Size: Large)   Pulse 65   Temp 97.7 F (36.5 C) (Temporal)    Ht 5' 7.5" (1.715 m)   Wt 235 lb (106.6 kg)   SpO2 98%   BMI 36.26 kg/m   Wt Readings from Last 3 Encounters:  02/25/23 235 lb (106.6 kg)  01/29/22 227 lb (103 kg)  12/19/20 223 lb 4.8 oz (101.3 kg)      Physical Exam Vitals and nursing note reviewed.  Constitutional:      General: He is not in acute distress.    Appearance: Normal appearance. He is well-developed. He is not ill-appearing.  HENT:     Head: Normocephalic and atraumatic.     Right Ear: Hearing, tympanic membrane, ear canal and external ear normal.     Left Ear: Hearing, tympanic membrane, ear canal and external ear normal.     Mouth/Throat:     Mouth: Mucous membranes are moist.     Pharynx: Oropharynx is clear. No oropharyngeal exudate or posterior oropharyngeal erythema.  Eyes:     General: No scleral icterus.    Extraocular Movements: Extraocular movements intact.     Conjunctiva/sclera: Conjunctivae normal.     Pupils: Pupils are equal, round, and reactive to light.  Neck:     Thyroid: No thyroid mass or thyromegaly.     Vascular: No carotid bruit.  Cardiovascular:     Rate and Rhythm: Normal rate and regular rhythm.     Pulses: Normal pulses.          Radial pulses are 2+ on the right side and 2+ on the left side.     Heart sounds: Normal heart sounds. No murmur heard. Pulmonary:     Effort: Pulmonary effort is normal. No respiratory distress.     Breath sounds: Normal breath sounds. No wheezing, rhonchi or rales.  Abdominal:     General: Bowel sounds are normal. There is no distension.     Palpations: Abdomen is soft. There is no mass.     Tenderness: There is no abdominal tenderness. There is no guarding or rebound.     Hernia: No hernia is present.  Musculoskeletal:        General: Normal range of motion.     Cervical back: Normal range of motion and neck supple.     Right lower leg: No edema.     Left lower leg: No edema.  Lymphadenopathy:     Cervical: No cervical adenopathy.  Skin:     General: Skin is warm and dry.     Findings: No rash.  Neurological:     General: No focal deficit present.     Mental Status: He is alert and oriented to person, place, and time.  Psychiatric:        Mood and Affect: Mood normal.        Behavior: Behavior normal.        Thought Content: Thought content normal.        Judgment: Judgment normal.  Results for orders placed or performed in visit on 02/18/23  Microalbumin / creatinine urine ratio  Result Value Ref Range   Microalb, Ur 1.2 0.0 - 1.9 mg/dL   Creatinine,U 161.0 mg/dL   Microalb Creat Ratio 0.3 0.0 - 30.0 mg/g  Uric acid  Result Value Ref Range   Uric Acid, Serum 4.9 4.0 - 7.8 mg/dL  PSA  Result Value Ref Range   PSA 0.29 0.10 - 4.00 ng/mL  Comprehensive metabolic panel  Result Value Ref Range   Sodium 142 135 - 145 mEq/L   Potassium 4.3 3.5 - 5.1 mEq/L   Chloride 104 96 - 112 mEq/L   CO2 30 19 - 32 mEq/L   Glucose, Bld 98 70 - 99 mg/dL   BUN 16 6 - 23 mg/dL   Creatinine, Ser 9.60 0.40 - 1.50 mg/dL   Total Bilirubin 0.6 0.2 - 1.2 mg/dL   Alkaline Phosphatase 79 39 - 117 U/L   AST 19 0 - 37 U/L   ALT 27 0 - 53 U/L   Total Protein 6.8 6.0 - 8.3 g/dL   Albumin 4.3 3.5 - 5.2 g/dL   GFR 45.40 >98.11 mL/min   Calcium 9.3 8.4 - 10.5 mg/dL  Lipid panel  Result Value Ref Range   Cholesterol 184 0 - 200 mg/dL   Triglycerides 914.7 0.0 - 149.0 mg/dL   HDL 82.95 (L) >62.13 mg/dL   VLDL 08.6 0.0 - 57.8 mg/dL   LDL Cholesterol 469 (H) 0 - 99 mg/dL   Total CHOL/HDL Ratio 5    NonHDL 148.96     Assessment & Plan:   Problem List Items Addressed This Visit     Healthcare maintenance - Primary (Chronic)    Preventative protocols reviewed and updated unless pt declined. Discussed healthy diet and lifestyle.       HLD (hyperlipidemia)    Chronic, off medications. Reviewed diet choices to improve cholesterol control. No fmhx premature CAD. Discussed coronary CTA screening - declined.  The 10-year ASCVD risk  score (Arnett DK, et al., 2019) is: 19.3%   Values used to calculate the score:     Age: 53 years     Sex: Male     Is Non-Hispanic African American: No     Diabetic: No     Tobacco smoker: No     Systolic Blood Pressure: 150 mmHg     Is BP treated: Yes     HDL Cholesterol: 34.8 mg/dL     Total Cholesterol: 184 mg/dL       Relevant Medications   amLODipine (NORVASC) 5 MG tablet   losartan (COZAAR) 25 MG tablet   Gout    Chronic, stable period on current allopurinol without recent gout flare.       Relevant Medications   allopurinol (ZYLOPRIM) 300 MG tablet   Benign prostatic hyperplasia    Chronic, stable period with normal PSA.      HTN (hypertension)    Chronic, remains uncontrolled. Will add low dose losartan to amlodipine in h/o gout.       Relevant Medications   amLODipine (NORVASC) 5 MG tablet   losartan (COZAAR) 25 MG tablet   Exposure to silica    Previously was getting CXR at work yearly.  Retired this past year. Update CXR today.       Relevant Orders   DG Chest 2 View   Severe obesity (BMI 35.0-39.9) with comorbidity (HCC)    Discussed healthy diet and lifestyle choices to affect  sustainable weight loss.  Obesity complicated by comorbidities of gout, HTN, HLD.       Other Visit Diagnoses     Need for shingles vaccine       Relevant Orders   Varicella-zoster vaccine IM (Completed)        Meds ordered this encounter  Medications   allopurinol (ZYLOPRIM) 300 MG tablet    Sig: Take 0.5 tablets (150 mg total) by mouth daily.    Dispense:  45 tablet    Refill:  4   amLODipine (NORVASC) 5 MG tablet    Sig: Take 1 tablet (5 mg total) by mouth daily.    Dispense:  90 tablet    Refill:  4   losartan (COZAAR) 25 MG tablet    Sig: Take 1 tablet (25 mg total) by mouth daily.    Dispense:  90 tablet    Refill:  4    New BP medicine in addition to amlodipine    Orders Placed This Encounter  Procedures   DG Chest 2 View    Standing Status:    Future    Number of Occurrences:   1    Standing Expiration Date:   02/25/2024    Order Specific Question:   Reason for Exam (SYMPTOM  OR DIAGNOSIS REQUIRED)    Answer:   longterm exposure to silica    Order Specific Question:   Preferred imaging location?    Answer:   [facility]   Varicella-zoster vaccine IM    Patient Instructions  2nd shingles shot today  Chest xray today.  BP was too high. Continue amlodipine 5mg  daily. Add losartan 25mg  daily for 2nd blood pressure medicine. Limit meloxicam to as needed for pain.  Monitor BP at home, let me know if consistently >140/90.  You may call [facility] to schedule an appointment at [phone] (Pyrt[facility]) for colonoscopy.  Return as needed or in 1 year for next physical   Follow up plan: Return in about 1 year (around 02/25/2024) for annual exam, prior fasting for blood work.  Eustaquio Boyden, MD

## 2023-02-25 NOTE — Assessment & Plan Note (Addendum)
Chronic, remains uncontrolled. Will add low dose losartan to amlodipine in h/o gout.

## 2023-02-25 NOTE — Assessment & Plan Note (Addendum)
Chronic, off medications. Reviewed diet choices to improve cholesterol control. No fmhx premature CAD. Discussed coronary CTA screening - declined.  The 10-year ASCVD risk score (Arnett DK, et al., 2019) is: 19.3%   Values used to calculate the score:     Age: 62 years     Sex: Male     Is Non-Hispanic African American: No     Diabetic: No     Tobacco smoker: No     Systolic Blood Pressure: 150 mmHg     Is BP treated: Yes     HDL Cholesterol: 34.8 mg/dL     Total Cholesterol: 184 mg/dL

## 2023-02-25 NOTE — Assessment & Plan Note (Signed)
Previously was getting CXR at work yearly.  Retired this past year. Update CXR today.

## 2023-02-25 NOTE — Assessment & Plan Note (Addendum)
Discussed healthy diet and lifestyle choices to affect sustainable weight loss.  Obesity complicated by comorbidities of gout, HTN, HLD.

## 2023-02-25 NOTE — Patient Instructions (Addendum)
2nd shingles shot today  Chest xray today.  BP was too high. Continue amlodipine 5mg  daily. Add losartan 25mg  daily for 2nd blood pressure medicine. Limit meloxicam to as needed for pain.  Monitor BP at home, let me know if consistently >140/90.  You may call [facility] to schedule an appointment at [phone] (Pyrtle) for colonoscopy.  Return as needed or in 1 year for next physical

## 2023-02-25 NOTE — Assessment & Plan Note (Signed)
Preventative protocols reviewed and updated unless pt declined. Discussed healthy diet and lifestyle.  

## 2023-02-25 NOTE — Assessment & Plan Note (Signed)
Chronic, stable period with normal PSA.

## 2023-02-25 NOTE — Assessment & Plan Note (Signed)
Chronic, stable period on current allopurinol without recent gout flare.

## 2023-06-04 ENCOUNTER — Other Ambulatory Visit: Payer: Self-pay | Admitting: Podiatry

## 2023-09-17 DIAGNOSIS — K8042 Calculus of bile duct with acute cholecystitis without obstruction: Secondary | ICD-10-CM

## 2023-09-17 HISTORY — PX: CHOLECYSTECTOMY: SHX55

## 2023-09-17 HISTORY — DX: Calculus of bile duct with acute cholecystitis without obstruction: K80.42

## 2023-09-30 ENCOUNTER — Emergency Department: Payer: PRIVATE HEALTH INSURANCE

## 2023-09-30 ENCOUNTER — Other Ambulatory Visit: Payer: Self-pay

## 2023-09-30 ENCOUNTER — Encounter: Payer: Self-pay | Admitting: Family Medicine

## 2023-09-30 ENCOUNTER — Inpatient Hospital Stay
Admission: EM | Admit: 2023-09-30 | Discharge: 2023-10-05 | DRG: 419 | Disposition: A | Discharge status: Home / Self Care | Payer: PRIVATE HEALTH INSURANCE | Attending: Internal Medicine | Admitting: Internal Medicine

## 2023-09-30 ENCOUNTER — Ambulatory Visit: Payer: No Typology Code available for payment source | Admitting: Family Medicine

## 2023-09-30 VITALS — BP 110/70 | HR 68 | Temp 98.5°F | Ht 67.5 in | Wt 237.4 lb

## 2023-09-30 DIAGNOSIS — R112 Nausea with vomiting, unspecified: Secondary | ICD-10-CM | POA: Insufficient documentation

## 2023-09-30 DIAGNOSIS — I10 Essential (primary) hypertension: Secondary | ICD-10-CM | POA: Diagnosis present

## 2023-09-30 DIAGNOSIS — R17 Unspecified jaundice: Secondary | ICD-10-CM | POA: Diagnosis not present

## 2023-09-30 DIAGNOSIS — K8050 Calculus of bile duct without cholangitis or cholecystitis without obstruction: Principal | ICD-10-CM | POA: Diagnosis present

## 2023-09-30 DIAGNOSIS — K8010 Calculus of gallbladder with chronic cholecystitis without obstruction: Secondary | ICD-10-CM | POA: Diagnosis not present

## 2023-09-30 DIAGNOSIS — Z841 Family history of disorders of kidney and ureter: Secondary | ICD-10-CM

## 2023-09-30 DIAGNOSIS — Z8249 Family history of ischemic heart disease and other diseases of the circulatory system: Secondary | ICD-10-CM

## 2023-09-30 DIAGNOSIS — Z833 Family history of diabetes mellitus: Secondary | ICD-10-CM

## 2023-09-30 DIAGNOSIS — K8020 Calculus of gallbladder without cholecystitis without obstruction: Secondary | ICD-10-CM

## 2023-09-30 DIAGNOSIS — K5730 Diverticulosis of large intestine without perforation or abscess without bleeding: Secondary | ICD-10-CM | POA: Diagnosis present

## 2023-09-30 DIAGNOSIS — R82998 Other abnormal findings in urine: Secondary | ICD-10-CM | POA: Diagnosis not present

## 2023-09-30 DIAGNOSIS — M546 Pain in thoracic spine: Secondary | ICD-10-CM | POA: Diagnosis not present

## 2023-09-30 DIAGNOSIS — Z6836 Body mass index (BMI) 36.0-36.9, adult: Secondary | ICD-10-CM

## 2023-09-30 DIAGNOSIS — R7989 Other specified abnormal findings of blood chemistry: Secondary | ICD-10-CM

## 2023-09-30 DIAGNOSIS — M109 Gout, unspecified: Secondary | ICD-10-CM | POA: Diagnosis present

## 2023-09-30 DIAGNOSIS — Z9079 Acquired absence of other genital organ(s): Secondary | ICD-10-CM

## 2023-09-30 DIAGNOSIS — M549 Dorsalgia, unspecified: Secondary | ICD-10-CM | POA: Insufficient documentation

## 2023-09-30 DIAGNOSIS — E785 Hyperlipidemia, unspecified: Secondary | ICD-10-CM | POA: Diagnosis present

## 2023-09-30 DIAGNOSIS — K760 Fatty (change of) liver, not elsewhere classified: Secondary | ICD-10-CM | POA: Diagnosis present

## 2023-09-30 DIAGNOSIS — E669 Obesity, unspecified: Secondary | ICD-10-CM | POA: Diagnosis present

## 2023-09-30 DIAGNOSIS — K219 Gastro-esophageal reflux disease without esophagitis: Secondary | ICD-10-CM | POA: Diagnosis present

## 2023-09-30 DIAGNOSIS — K810 Acute cholecystitis: Secondary | ICD-10-CM

## 2023-09-30 DIAGNOSIS — Z87442 Personal history of urinary calculi: Secondary | ICD-10-CM

## 2023-09-30 DIAGNOSIS — R531 Weakness: Secondary | ICD-10-CM | POA: Diagnosis not present

## 2023-09-30 DIAGNOSIS — Z79899 Other long term (current) drug therapy: Secondary | ICD-10-CM

## 2023-09-30 LAB — COMPREHENSIVE METABOLIC PANEL
ALT: 216 U/L — ABNORMAL HIGH (ref 0–44)
AST: 98 U/L — ABNORMAL HIGH (ref 15–41)
Albumin: 4.1 g/dL (ref 3.5–5.0)
Alkaline Phosphatase: 191 U/L — ABNORMAL HIGH (ref 38–126)
Anion gap: 14 (ref 5–15)
BUN: 17 mg/dL (ref 8–23)
CO2: 23 mmol/L (ref 22–32)
Calcium: 9 mg/dL (ref 8.9–10.3)
Chloride: 100 mmol/L (ref 98–111)
Creatinine, Ser: 0.71 mg/dL (ref 0.61–1.24)
GFR, Estimated: >60 mL/min (ref >60)
Glucose, Bld: 111 mg/dL — ABNORMAL HIGH (ref 70–99)
Potassium: 3.6 mmol/L (ref 3.5–5.1)
Sodium: 137 mmol/L (ref 135–145)
Total Bilirubin: 10.7 mg/dL — ABNORMAL HIGH (ref 0.0–1.2)
Total Protein: 7.9 g/dL (ref 6.5–8.1)

## 2023-09-30 LAB — CBC WITH DIFFERENTIAL/PLATELET
Abs Immature Granulocytes: 0.02 10*3/uL (ref 0.00–0.07)
Basophils Absolute: 0 10*3/uL (ref 0.0–0.1)
Basophils Absolute: 0 10*3/uL (ref 0.0–0.1)
Basophils Relative: 0.3 % (ref 0.0–3.0)
Basophils Relative: 0 %
Eosinophils Absolute: 0.1 10*3/uL (ref 0.0–0.7)
Eosinophils Absolute: 0.1 10*3/uL (ref 0.0–0.5)
Eosinophils Relative: 1 % (ref 0.0–5.0)
Eosinophils Relative: 1 %
HCT: 48.8 % (ref 39.0–52.0)
HCT: 50 % (ref 39.0–52.0)
Hemoglobin: 16.2 g/dL (ref 13.0–17.0)
Hemoglobin: 16.7 g/dL (ref 13.0–17.0)
Immature Granulocytes: 0 %
Lymphocytes Relative: 15.5 % (ref 12.0–46.0)
Lymphocytes Relative: 14 %
Lymphs Abs: 1 10*3/uL (ref 0.7–4.0)
Lymphs Abs: 1 10*3/uL (ref 0.7–4.0)
MCH: 29.9 pg (ref 26.0–34.0)
MCHC: 33.2 g/dL (ref 30.0–36.0)
MCHC: 33.4 g/dL (ref 30.0–36.0)
MCV: 91.4 fL (ref 78.0–100.0)
MCV: 89.6 fL (ref 80.0–100.0)
Monocytes Absolute: 0.7 10*3/uL (ref 0.1–1.0)
Monocytes Absolute: 0.6 10*3/uL (ref 0.1–1.0)
Monocytes Relative: 10.5 % (ref 3.0–12.0)
Monocytes Relative: 8 %
Neutro Abs: 4.9 10*3/uL (ref 1.4–7.7)
Neutro Abs: 5.3 10*3/uL (ref 1.7–7.7)
Neutrophils Relative %: 72.7 % (ref 43.0–77.0)
Neutrophils Relative %: 77 %
Platelets: 184 10*3/uL (ref 150.0–400.0)
Platelets: 198 10*3/uL (ref 150–400)
RBC: 5.35 Mil/uL (ref 4.22–5.81)
RBC: 5.58 MIL/uL (ref 4.22–5.81)
RDW: 13.3 % (ref 11.5–15.5)
RDW: 13.2 % (ref 11.5–15.5)
WBC: 6.8 10*3/uL (ref 4.0–10.5)
WBC: 7 10*3/uL (ref 4.0–10.5)
nRBC: 0 % (ref 0.0–0.2)

## 2023-09-30 LAB — BASIC METABOLIC PANEL
BUN: 16 mg/dL (ref 6–23)
CO2: 27 meq/L (ref 19–32)
Calcium: 8.7 mg/dL (ref 8.4–10.5)
Chloride: 101 meq/L (ref 96–112)
Creatinine, Ser: 1.06 mg/dL (ref 0.40–1.50)
GFR: 74.53 mL/min (ref >60.00)
Glucose, Bld: 108 mg/dL — ABNORMAL HIGH (ref 70–99)
Potassium: 3.8 meq/L (ref 3.5–5.1)
Sodium: 135 meq/L (ref 135–145)

## 2023-09-30 LAB — AMMONIA: Ammonia: 28 umol/L (ref 9–35)

## 2023-09-30 LAB — POC URINALSYSI DIPSTICK (AUTOMATED)
Bilirubin, UA: 4
Blood, UA: NEGATIVE
Glucose, UA: NEGATIVE
Ketones, UA: 5
Leukocytes, UA: NEGATIVE
Nitrite, UA: NEGATIVE
Protein, UA: NEGATIVE
Spec Grav, UA: 1.015 (ref 1.010–1.025)
Urobilinogen, UA: 0.2 U/dL
pH, UA: 6 (ref 5.0–8.0)

## 2023-09-30 LAB — HEPATIC FUNCTION PANEL
ALT: 212 U/L — ABNORMAL HIGH (ref 0–53)
AST: 98 U/L — ABNORMAL HIGH (ref 0–37)
Albumin: 4.2 g/dL (ref 3.5–5.2)
Alkaline Phosphatase: 210 U/L — ABNORMAL HIGH (ref 39–117)
Bilirubin, Direct: 6.3 mg/dL — ABNORMAL HIGH (ref 0.0–0.3)
Total Bilirubin: 9.1 mg/dL — ABNORMAL HIGH (ref 0.2–1.2)
Total Protein: 7.4 g/dL (ref 6.0–8.3)

## 2023-09-30 LAB — LIPASE, BLOOD: Lipase: 36 U/L (ref 11–51)

## 2023-09-30 LAB — LIPASE: Lipase: 26 U/L (ref 11.0–59.0)

## 2023-09-30 MED ORDER — IOHEXOL 300 MG/ML  SOLN
100.0000 mL | Freq: Once | INTRAMUSCULAR | Status: AC | PRN
  Administered 2023-09-30: 100 mL via INTRAVENOUS

## 2023-09-30 NOTE — Progress Notes (Signed)
Subjective:    Patient ID: Timothy Landry, male    DOB: 12-09-1960, 63 y.o.   MRN: [id number]  HPI  Wt Readings from Last 3 Encounters:  09/30/23 237 lb 6 oz (107.7 kg)  02/25/23 235 lb (106.6 kg)  01/29/22 227 lb (103 kg)   36.63 kg/m  Vitals:   09/30/23 1210  BP: 110/70  Pulse: 68  Temp: 98.5 F (36.9 C)  SpO2: 95%   63 yo pt of Dr Reece Agar presents with dark urine and back pain  Fatigue N/v Less appetite      Has a history of BPH with photovaporization (Dr Evelene Croon) Lab Results  Component Value Date   PSA 0.29 02/18/2023   PSA 0.22 12/11/2020   PSA 0.26 09/19/2019    Upper back pain  Gets nauseated with it  Has vomited  At least one episode weekly for 3 weeks  Spicy food and fatty food will cause it   Still has a gallbladder   No dysuria No frequency or urgency    Urinalysis today  Bilirubin  Ketones   No known exp to hepatitis     Results for orders placed or performed in visit on 09/30/23  POCT Urinalysis Dipstick (Automated)   Collection Time: 09/30/23 12:25 PM  Result Value Ref Range   Color, UA Amber    Clarity, UA Clear    Glucose, UA Negative Negative   Bilirubin, UA 4 mg/dL    Ketones, UA 5 mg/dL    Spec Grav, UA 4.098 1.010 - 1.025   Blood, UA Negative    pH, UA 6.0 5.0 - 8.0   Protein, UA Negative Negative   Urobilinogen, UA 0.2 0.2 or 1.0 E.U./dL   Nitrite, UA Negative    Leukocytes, UA Negative Negative    Does not drink more water  Does not like taste of water     Lab Results  Component Value Date   NA 142 02/18/2023   K 4.3 02/18/2023   CO2 30 02/18/2023   GLUCOSE 98 02/18/2023   BUN 16 02/18/2023   CREATININE 1.01 02/18/2023   CALCIUM 9.3 02/18/2023   GFR 79.32 02/18/2023   GFRNONAA >60 12/13/2012   Lab Results  Component Value Date   ALT 27 02/18/2023   AST 19 02/18/2023   ALKPHOS 79 02/18/2023   BILITOT 0.6 02/18/2023   Lab Results  Component Value Date   WBC 6.1 12/11/2020   HGB 16.3 12/11/2020   HCT  49.1 12/11/2020   MCV 90.5 12/11/2020   PLT 179.0 12/11/2020       Patient Active Problem List   Diagnosis Date Noted   Jaundice 09/30/2023   Back pain 09/30/2023   Nausea & vomiting 09/30/2023   Severe obesity (BMI 35.0-39.9) with comorbidity (HCC) 01/29/2022   Exposure to silica 09/06/2018   HTN (hypertension) 08/26/2016   Benign prostatic hyperplasia    PVC (premature ventricular contraction) 05/31/2012   Healthcare maintenance 03/03/2012   HLD (hyperlipidemia) 08/28/2007   Gout 08/28/2007   ECZEMA 08/28/2007   Hiatal hernia with GERD 08/23/2007   Past Medical History:  Diagnosis Date   BPH (benign prostatic hypertrophy) 2016   s/p photovaporization Evelene Croon)   Diverticulosis 04/2012   GERD (gastroesophageal reflux disease)    Gout 08/1996   History of hiatal hernia 08/1996   Barium Swallow HH, Mod . Size   Hyperlipidemia 12/1998   Nephrolithiasis 2014   stent placed, then removed   Self-catheterizes urinary bladder    2-3  times/day   Past Surgical History:  Procedure Laterality Date   COLONOSCOPY  04/2012   mod diverticulosis, 3 polyps - benign, rec rpt 10 yrs (Pyrtle)   CYSTECTOMY  1994   cystectomy right foot and gluteal area   CYSTOSCOPY WITH LITHOLAPAXY  06/03/2015   Procedure: CYSTOSCOPY WITH LITHOLAPAXY WITH HOLMIUM LASER ;  Surgeon: Orson Ape, MD;  Location: [facility] ORS;  Service: Urology;;   GREEN LIGHT LASER TURP (TRANSURETHRAL RESECTION OF PROSTATE N/A 06/03/2015   Procedure: GREEN LIGHT LASER TURP (TRANSURETHRAL RESECTION OF PROSTATE;  Surgeon: Orson Ape, MD;  Location: [facility] ORS;  Service: Urology;  Laterality: N/A;   LITHOTRIPSY  1999   LITHOTRIPSY  2014   with stent placement, s/p removal Evelene Croon)   stress echocardiogram  05/2015   overall normal after max exercise, severe hypertensive response, EF 60-65%   WRIST GANGLION EXCISION     Social History   Tobacco Use   Smoking status: Never   Smokeless tobacco: Current    Types: Chew    Tobacco comments:    1 can/day  Substance Use Topics   Alcohol use: Yes    Comment: rare beer   Drug use: No   Family History  Problem Relation Age of Onset   Hypertension Mother    Diabetes Father    Hypertension Father    Kidney disease Father    Hypertension Sister    CAD Paternal Grandfather 29       MI   Cancer Neg Hx    Stroke Neg Hx    No Known Allergies Current Outpatient Medications on File Prior to Visit  Medication Sig Dispense Refill   allopurinol (ZYLOPRIM) 300 MG tablet Take 0.5 tablets (150 mg total) by mouth daily. 45 tablet 4   amLODipine (NORVASC) 5 MG tablet Take 1 tablet (5 mg total) by mouth daily. 90 tablet 4   fluticasone (FLONASE) 50 MCG/ACT nasal spray Place 2 sprays into both nostrils daily. 16 g 6   losartan (COZAAR) 25 MG tablet Take 1 tablet (25 mg total) by mouth daily. 90 tablet 4   omeprazole (PRILOSEC OTC) 20 MG tablet Take 20 mg by mouth every morning.      No current facility-administered medications on file prior to visit.    Review of Systems  Constitutional:  Positive for appetite change and fatigue. Negative for activity change, fever and unexpected weight change.  HENT:  Negative for congestion, rhinorrhea (stools are lighter in color), sore throat and trouble swallowing.   Eyes:  Negative for pain, redness, itching and visual disturbance.  Respiratory:  Negative for cough, chest tightness, shortness of breath and wheezing.   Cardiovascular:  Negative for chest pain and palpitations.  Gastrointestinal:  Positive for nausea and vomiting. Negative for abdominal pain, blood in stool, constipation and diarrhea.  Endocrine: Negative for cold intolerance, heat intolerance, polydipsia and polyuria.  Genitourinary:  Negative for difficulty urinating, dysuria, frequency and urgency.  Musculoskeletal:  Negative for arthralgias, joint swelling and myalgias.  Skin:  Positive for color change. Negative for pallor and rash.  Neurological:  Negative  for dizziness, tremors, weakness, numbness and headaches.  Hematological:  Negative for adenopathy. Does not bruise/bleed easily.  Psychiatric/Behavioral:  Negative for decreased concentration and dysphoric mood. The patient is not nervous/anxious.        Objective:   Physical Exam Constitutional:      General: He is not in acute distress.    Appearance: Normal appearance. He is well-developed. He is  obese. He is not ill-appearing or diaphoretic.  HENT:     Head: Normocephalic and atraumatic.     Mouth/Throat:     Mouth: Mucous membranes are moist.     Pharynx: Oropharynx is clear.  Eyes:     General: Scleral icterus present.     Conjunctiva/sclera: Conjunctivae normal.     Pupils: Pupils are equal, round, and reactive to light.  Neck:     Vascular: No carotid bruit.  Cardiovascular:     Rate and Rhythm: Normal rate and regular rhythm.     Heart sounds: Normal heart sounds.  Pulmonary:     Effort: Pulmonary effort is normal. No respiratory distress.     Breath sounds: Normal breath sounds. No stridor. No wheezing, rhonchi or rales.  Abdominal:     General: Abdomen is protuberant. Bowel sounds are normal. There is no distension.     Palpations: Abdomen is soft. There is no fluid wave, hepatomegaly, splenomegaly, mass or pulsatile mass.     Tenderness: There is abdominal tenderness in the right upper quadrant. There is no right CVA tenderness, left CVA tenderness, guarding or rebound. Positive signs include Murphy's sign.  Musculoskeletal:     Cervical back: Normal range of motion and neck supple. No tenderness.     Right lower leg: No edema.     Left lower leg: No edema.  Lymphadenopathy:     Cervical: No cervical adenopathy.  Skin:    General: Skin is warm and dry.     Coloration: Skin is jaundiced. Skin is not pale.     Findings: No erythema.     Comments: Mild jaundice    Neurological:     Mental Status: He is alert.     Comments: No tremor   Psychiatric:        Mood  and Affect: Mood normal.     Comments: Pleasant  Talkative            Assessment & Plan:   Problem List Items Addressed This Visit       Digestive   Nausea & vomiting   Intermittent/occurs with mid back pain in episodes  Occurs after eating fatty foods sometimes  Also fatigue  Mild jaundice today and bilirubin in urine  Some upper abd tenderness/ murphy sign on exam as well  Labs pending  May need imaging   Call back and Er precautions noted in detail today         Relevant Orders   Hepatic function panel   Acute Hep Panel & Hep B Surface Ab   CBC with Differential/Platelet   Reticulocytes   Basic metabolic panel   Lipase     Other   Jaundice   Mild jaundice/ icterus and bili in urine  Back pain , nausea/vomiting, fatigue Positive murphy's sign on exam   Labs pending  Will likely need imaging of liver / gb   Instructed to avoid etoh (does not drink) Avoid tylenol for now   Call back and Er precautions noted in detail today          Relevant Orders   Hepatic function panel   Acute Hep Panel & Hep B Surface Ab   CBC with Differential/Platelet   Reticulocytes   Basic metabolic panel   Back pain   Intermittent / sometimes after eating  With n/v  Mid/thoracic  Normal MS exam   Also -fatigue/ mild jaundice and bili in urine  Lab pending  Liver or gallbladder pathology in  differential  See a/p for jaundice       Other Visit Diagnoses       Dark urine    -  Primary   Relevant Orders   POCT Urinalysis Dipstick (Automated) (Completed)   Hepatic function panel   Acute Hep Panel & Hep B Surface Ab   CBC with Differential/Platelet   Reticulocytes   Basic metabolic panel

## 2023-09-30 NOTE — Assessment & Plan Note (Addendum)
Mild jaundice/ icterus and bili in urine  Back pain , nausea/vomiting, fatigue Positive murphy's sign on exam   Labs pending  Will likely need imaging of liver / gb   Instructed to avoid etoh (does not drink) Avoid tylenol for now   Call back and Er precautions noted in detail today

## 2023-09-30 NOTE — ED Notes (Signed)
Family to desk to ask about wait times. Pt family educated.

## 2023-09-30 NOTE — Patient Instructions (Signed)
Labs today for chem levels and liver tests Avoid tylenol and alcohol  Avoid fatty foods If symptoms worsen let us know (if severe go to the ER)

## 2023-09-30 NOTE — Assessment & Plan Note (Signed)
Intermittent / sometimes after eating  With n/v  Mid/thoracic  Normal MS exam   Also -fatigue/ mild jaundice and bili in urine  Lab pending  Liver or gallbladder pathology in differential  See a/p for jaundice

## 2023-09-30 NOTE — Assessment & Plan Note (Addendum)
Intermittent/occurs with mid back pain in episodes  Occurs after eating fatty foods sometimes  Also fatigue  Mild jaundice today and bilirubin in urine  Some upper abd tenderness/ murphy sign on exam as well  Labs pending  May need imaging   Call back and Er precautions noted in detail today

## 2023-09-30 NOTE — ED Provider Triage Note (Signed)
Emergency Medicine Provider Triage Evaluation Note  LEVII HAIRFIELD , a 63 y.o. male  was evaluated in triage.  Pt complains of generalized weakness.  Seen at PCP had liver abnormalities.  No prior liver disease.  Review of Systems  Positive:  Negative:   Physical Exam  There were no vitals taken for this visit. Gen:   Awake, no distress   Resp:  Normal effort  MSK:   Moves extremities without difficulty  Other:    Medical Decision Making  Medically screening exam initiated at 5:41 PM.  Appropriate orders placed.  NICKALOS PETERSEN was informed that the remainder of the evaluation will be completed by another provider, this initial triage assessment does not replace that evaluation, and the importance of remaining in the ED until their evaluation is complete.  Further evaluation with labs CT and ultrasound   Christen Bame, PA-C 09/30/23 1741

## 2023-09-30 NOTE — ED Triage Notes (Signed)
Pt c/o high liver enzymes- was sent by PCP for urgent eval for gallbladder or liver scan. Pt states his urine has been orange. Pt denies ETOH/drug use. Pt denies abdominal pain.

## 2023-10-01 ENCOUNTER — Emergency Department: Payer: PRIVATE HEALTH INSURANCE

## 2023-10-01 ENCOUNTER — Encounter: Payer: Self-pay | Admitting: Radiology

## 2023-10-01 DIAGNOSIS — K8010 Calculus of gallbladder with chronic cholecystitis without obstruction: Secondary | ICD-10-CM | POA: Diagnosis present

## 2023-10-01 DIAGNOSIS — R7989 Other specified abnormal findings of blood chemistry: Secondary | ICD-10-CM | POA: Diagnosis not present

## 2023-10-01 DIAGNOSIS — Z9079 Acquired absence of other genital organ(s): Secondary | ICD-10-CM | POA: Diagnosis not present

## 2023-10-01 DIAGNOSIS — R531 Weakness: Secondary | ICD-10-CM | POA: Diagnosis present

## 2023-10-01 DIAGNOSIS — K5710 Diverticulosis of small intestine without perforation or abscess without bleeding: Secondary | ICD-10-CM | POA: Diagnosis not present

## 2023-10-01 DIAGNOSIS — Z841 Family history of disorders of kidney and ureter: Secondary | ICD-10-CM | POA: Diagnosis not present

## 2023-10-01 DIAGNOSIS — I10 Essential (primary) hypertension: Secondary | ICD-10-CM | POA: Diagnosis present

## 2023-10-01 DIAGNOSIS — Z8249 Family history of ischemic heart disease and other diseases of the circulatory system: Secondary | ICD-10-CM | POA: Diagnosis not present

## 2023-10-01 DIAGNOSIS — K8050 Calculus of bile duct without cholangitis or cholecystitis without obstruction: Secondary | ICD-10-CM | POA: Diagnosis not present

## 2023-10-01 DIAGNOSIS — Z87442 Personal history of urinary calculi: Secondary | ICD-10-CM | POA: Diagnosis not present

## 2023-10-01 DIAGNOSIS — E785 Hyperlipidemia, unspecified: Secondary | ICD-10-CM | POA: Diagnosis present

## 2023-10-01 DIAGNOSIS — K8062 Calculus of gallbladder and bile duct with acute cholecystitis without obstruction: Secondary | ICD-10-CM | POA: Diagnosis not present

## 2023-10-01 DIAGNOSIS — Z6836 Body mass index (BMI) 36.0-36.9, adult: Secondary | ICD-10-CM | POA: Diagnosis not present

## 2023-10-01 DIAGNOSIS — M109 Gout, unspecified: Secondary | ICD-10-CM | POA: Diagnosis present

## 2023-10-01 DIAGNOSIS — Z79899 Other long term (current) drug therapy: Secondary | ICD-10-CM | POA: Diagnosis not present

## 2023-10-01 DIAGNOSIS — K219 Gastro-esophageal reflux disease without esophagitis: Secondary | ICD-10-CM | POA: Diagnosis present

## 2023-10-01 DIAGNOSIS — Z833 Family history of diabetes mellitus: Secondary | ICD-10-CM | POA: Diagnosis not present

## 2023-10-01 DIAGNOSIS — K5730 Diverticulosis of large intestine without perforation or abscess without bleeding: Secondary | ICD-10-CM | POA: Diagnosis present

## 2023-10-01 DIAGNOSIS — E669 Obesity, unspecified: Secondary | ICD-10-CM | POA: Diagnosis present

## 2023-10-01 DIAGNOSIS — K760 Fatty (change of) liver, not elsewhere classified: Secondary | ICD-10-CM | POA: Diagnosis present

## 2023-10-01 HISTORY — DX: Calculus of bile duct without cholangitis or cholecystitis without obstruction: K80.50

## 2023-10-01 LAB — HEPATITIS PANEL, ACUTE
HCV Ab: NONREACTIVE
Hep A IgM: NONREACTIVE
Hep B C IgM: NONREACTIVE
Hepatitis B Surface Ag: NONREACTIVE

## 2023-10-01 LAB — PROTIME-INR
INR: 1.1 (ref 0.8–1.2)
Prothrombin Time: 14.4 s (ref 11.4–15.2)

## 2023-10-01 MED ORDER — ACETAMINOPHEN 650 MG RE SUPP
650.0000 mg | Freq: Four times a day (QID) | RECTAL | Status: DC | PRN
Start: 2023-10-01 — End: 2023-10-05

## 2023-10-01 MED ORDER — AMLODIPINE BESYLATE 5 MG PO TABS
5.0000 mg | ORAL_TABLET | Freq: Every day | ORAL | Status: DC
  Administered 2023-10-01 – 2023-10-05 (×5): 5 mg via ORAL
  Filled 2023-10-01 (×5): qty 1

## 2023-10-01 MED ORDER — ALBUTEROL SULFATE (2.5 MG/3ML) 0.083% IN NEBU: 2.5000 mg | INHALATION_SOLUTION | RESPIRATORY_TRACT | Status: AC | PRN

## 2023-10-01 MED ORDER — MORPHINE SULFATE (PF) 4 MG/ML IV SOLN: 4.0000 mg | INTRAVENOUS | Status: DC | PRN

## 2023-10-01 MED ORDER — LOSARTAN POTASSIUM 25 MG PO TABS
25.0000 mg | ORAL_TABLET | Freq: Every day | ORAL | Status: DC
  Administered 2023-10-01 – 2023-10-05 (×5): 25 mg via ORAL
  Filled 2023-10-01 (×5): qty 1

## 2023-10-01 MED ORDER — ACETAMINOPHEN 325 MG PO TABS: 650.0000 mg | ORAL_TABLET | Freq: Four times a day (QID) | ORAL | Status: DC | PRN

## 2023-10-01 MED ORDER — ONDANSETRON HCL 4 MG/2ML IJ SOLN
4.0000 mg | Freq: Three times a day (TID) | INTRAMUSCULAR | Status: DC | PRN
  Administered 2023-10-03: 4 mg via INTRAVENOUS
  Filled 2023-10-01: qty 2

## 2023-10-01 MED ORDER — GADOBUTROL 1 MMOL/ML IV SOLN
10.0000 mL | Freq: Once | INTRAVENOUS | Status: AC | PRN
  Administered 2023-10-01: 10 mL via INTRAVENOUS

## 2023-10-01 MED ORDER — FLUTICASONE PROPIONATE 50 MCG/ACT NA SUSP
2.0000 | Freq: Every day | NASAL | Status: DC
  Administered 2023-10-02 – 2023-10-05 (×4): 2 via NASAL
  Filled 2023-10-01: qty 16

## 2023-10-01 MED ORDER — PANTOPRAZOLE SODIUM 40 MG PO TBEC
40.0000 mg | DELAYED_RELEASE_TABLET | Freq: Every day | ORAL | Status: DC
  Administered 2023-10-01 – 2023-10-05 (×5): 40 mg via ORAL
  Filled 2023-10-01 (×5): qty 1

## 2023-10-01 MED ORDER — LORATADINE 10 MG PO TABS
10.0000 mg | ORAL_TABLET | Freq: Every day | ORAL | Status: DC
  Administered 2023-10-01 – 2023-10-05 (×5): 10 mg via ORAL
  Filled 2023-10-01 (×5): qty 1

## 2023-10-01 MED ORDER — HYDROCODONE-ACETAMINOPHEN 5-325 MG PO TABS
1.0000 | ORAL_TABLET | ORAL | Status: DC | PRN
  Administered 2023-10-02 – 2023-10-03 (×2): 1 via ORAL
  Filled 2023-10-01: qty 2
  Filled 2023-10-01: qty 1

## 2023-10-01 MED ORDER — SODIUM CHLORIDE 0.9 % IV SOLN: INTRAVENOUS | Status: DC

## 2023-10-01 MED ORDER — SODIUM CHLORIDE 0.9 % IV BOLUS (SEPSIS)
1000.0000 mL | Freq: Once | INTRAVENOUS | Status: AC
  Administered 2023-10-01: 1000 mL via INTRAVENOUS

## 2023-10-01 NOTE — H&P (Addendum)
 History and Physical    Patient: Timothy Landry LKG:[id number] DOB: 12/23/1960 DOA: 09/30/2023 DOS: the patient was seen and examined on 10/01/2023 PCP: Eustaquio Boyden, MD  Patient coming from: Home  Chief Complaint: Abnormal Lab Results  HPI: Timothy Landry is a 63 y.o. male with medical history significant of  HTN, HLD, BPH, gout. He presented to the [facility] ED yesterday evening after his PCP notified him of elevated LFTs on outpatient labs. He reports a 3 week history of intermittent right posterior back pain with associated non-bloody and non-bilious emesis.  He reports he had similar symptoms approximately 1 year ago that self resolved.  This time in addition to the intermittent back pain he also noticed his urine was orange on Wednesday of this week.  He denies abdominal pain, diarrhea, dysuria, or hematuria.  He reports infrequent acetaminophen use and regular use of ibuprofen, denies alcohol use, or history of liver disease.  ED Course: On arrival to [facility] ED patient was noted to be afebrile temp 36.7C, BP 152/115, HR 81, RR 17, SpO2 95% on room air.  Labs notable for AST 98, ALT 216, alkaline phosphatase 192, and T. bili 10.7. Acute hepatitis panel pending.  RUQ ultrasound obtained and shows cholelithiasis with gallbladder wall thickening and hepatic steatosis.  CT abdomen pelvis obtained and shows gallbladder wall thickening, passed steatosis, colonic diverticulosis with no acute diverticulitis, and questionable mild cystitis.  MRCP obtained and shows tiny filling defects within the distal extrahepatic bile duct measuring up to 2 mm in size compatible tiny stones or debris and choledocholithiasis without associated biliary duct dilation.  In the ED he was given a 2 L NS bolus.  Gastroenterology consulted and [facility] contacted for admission.  Review of Systems: As mentioned in the history of present illness. All other systems reviewed and are negative. Past Medical History:  Diagnosis  Date   BPH (benign prostatic hypertrophy) 2016   s/p photovaporization Evelene Croon)   Diverticulosis 04/2012   GERD (gastroesophageal reflux disease)    Gout 08/1996   History of hiatal hernia 08/1996   Barium Swallow HH, Mod . Size   Hyperlipidemia 12/1998   Nephrolithiasis 2014   stent placed, then removed   Self-catheterizes urinary bladder    2-3 times/day   Past Surgical History:  Procedure Laterality Date   COLONOSCOPY  04/2012   mod diverticulosis, 3 polyps - benign, rec rpt 10 yrs (Pyrtle)   CYSTECTOMY  1994   cystectomy right foot and gluteal area   CYSTOSCOPY WITH LITHOLAPAXY  06/03/2015   Procedure: CYSTOSCOPY WITH LITHOLAPAXY WITH HOLMIUM LASER ;  Surgeon: Orson Ape, MD;  Location: [facility] ORS;  Service: Urology;;   GREEN LIGHT LASER TURP (TRANSURETHRAL RESECTION OF PROSTATE N/A 06/03/2015   Procedure: GREEN LIGHT LASER TURP (TRANSURETHRAL RESECTION OF PROSTATE;  Surgeon: Orson Ape, MD;  Location: [facility] ORS;  Service: Urology;  Laterality: N/A;   LITHOTRIPSY  1999   LITHOTRIPSY  2014   with stent placement, s/p removal Evelene Croon)   stress echocardiogram  05/2015   overall normal after max exercise, severe hypertensive response, EF 60-65%   WRIST GANGLION EXCISION     Social History:  reports that he has never smoked. His smokeless tobacco use includes chew. He reports current alcohol use. He reports that he does not use drugs.  No Known Allergies  Family History  Problem Relation Age of Onset   Hypertension Mother    Diabetes Father    Hypertension Father  Kidney disease Father    Hypertension Sister    CAD Paternal Grandfather 75       MI   Cancer Neg Hx    Stroke Neg Hx     Prior to Admission medications   Medication Sig Start Date End Date Taking? Authorizing Provider  allopurinol (ZYLOPRIM) 300 MG tablet Take 0.5 tablets (150 mg total) by mouth daily. 02/25/23   Eustaquio Boyden, MD  amLODipine (NORVASC) 5 MG tablet Take 1 tablet (5 mg total) by  mouth daily. 02/25/23   Eustaquio Boyden, MD  fluticasone [facility]) 50 MCG/ACT nasal spray Place 2 sprays into both nostrils daily. 01/29/22   Eustaquio Boyden, MD  losartan (COZAAR) 25 MG tablet Take 1 tablet (25 mg total) by mouth daily. 02/25/23   Eustaquio Boyden, MD  omeprazole (PRILOSEC OTC) 20 MG tablet Take 20 mg by mouth every morning.     [provider]    Physical Exam: Vitals:   10/01/23 0445 10/01/23 0637 10/01/23 0645 10/01/23 0727  BP: (!) 156/109 (!) 151/95  (!) 159/94  Pulse: 79 60 62 64  Resp: 18 18  17   Temp: 98 F (36.7 C)     TempSrc: Oral     SpO2: 93% 92% 92% 92%   Constitutional: NAD, calm, comfortable Eyes: PERRL, scleral icterus, lids and conjunctivae normal ENMT: Mucous membranes are moist. Posterior pharynx clear of any exudate or lesions.  Neck: normal, supple, no masses, no thyromegaly Respiratory: clear to auscultation bilaterally, no wheezing, no crackles. Normal respiratory effort. No accessory muscle use.  Cardiovascular: Regular rate and rhythm, no murmurs / rubs / gallops. No extremity edema. 2+ radial and pedal pulses.   Abdomen: Rounded obese abdomen. No tenderness, no masses palpated. Negative murphy's sign. No hepatosplenomegaly. Bowel sounds positive x4 quadrants.  Musculoskeletal: no clubbing / cyanosis. No joint deformity upper and lower extremities. Good ROM, no contractures. Normal muscle tone.  Skin: no rashes, lesions, ulcers. Mild jaundice noted Neurologic: CN 2-12 grossly intact. Sensation intact,  Alert and oriented x 3.   Data Reviewed: CBC    Component Value Date/Time   WBC 7.0 09/30/2023 1741   RBC 5.58 09/30/2023 1741   HGB 16.7 09/30/2023 1741   HGB 16.3 12/13/2012 1706   HCT 50.0 09/30/2023 1741   HCT 47.9 12/13/2012 1706   PLT 198 09/30/2023 1741   PLT 201 12/13/2012 1706   MCV 89.6 09/30/2023 1741   MCV 89 12/13/2012 1706   MCH 29.9 09/30/2023 1741   MCHC 33.4 09/30/2023 1741   RDW 13.2 09/30/2023 1741    RDW 13.2 12/13/2012 1706   LYMPHSABS 1.0 09/30/2023 1741   MONOABS 0.6 09/30/2023 1741   EOSABS 0.1 09/30/2023 1741   BASOSABS 0.0 09/30/2023 1741   CMP     Component Value Date/Time   NA 137 09/30/2023 1741   NA 142 12/13/2012 1706   K 3.6 09/30/2023 1741   K 3.9 12/13/2012 1706   CL 100 09/30/2023 1741   CL 109 (H) 12/13/2012 1706   CO2 23 09/30/2023 1741   CO2 21 12/13/2012 1706   GLUCOSE 111 (H) 09/30/2023 1741   GLUCOSE 108 (H) 12/13/2012 1706   BUN 17 09/30/2023 1741   BUN 23 (H) 12/13/2012 1706   CREATININE 0.71 09/30/2023 1741   CREATININE 1.32 (H) 12/13/2012 1706   CALCIUM 9.0 09/30/2023 1741   CALCIUM 9.7 12/13/2012 1706   PROT 7.9 09/30/2023 1741   PROT 7.8 12/13/2012 1706   ALBUMIN 4.1 09/30/2023 1741  ALBUMIN 4.5 12/13/2012 1706   AST 98 (H) 09/30/2023 1741   AST 26 12/13/2012 1706   ALT 216 (H) 09/30/2023 1741   ALT 20 12/13/2012 1706   ALKPHOS 191 (H) 09/30/2023 1741   ALKPHOS 66 12/13/2012 1706   BILITOT 10.7 (H) 09/30/2023 1741   BILITOT 1.2 (H) 12/13/2012 1706   GFR 74.53 09/30/2023 1246   GFRNONAA >60 09/30/2023 1741   GFRNONAA >60 12/13/2012 1706   Lipase     Component Value Date/Time   LIPASE 36 09/30/2023 1741   Ammonia    Component Value Date/Time   AMMONIA 28 09/30/2023 1741   Protime-INR    Component Value Date/Time   PROTIME INR 14.4 1.1 10/01/2023 0121 10/01/2023 0121   MR ABDOMEN MRCP W WO CONTAST Result Date: 10/01/2023 CLINICAL DATA:  Elevated liver function tests EXAM: MRI ABDOMEN WITHOUT AND WITH CONTRAST (INCLUDING MRCP) TECHNIQUE: Multiplanar multisequence MR imaging of the abdomen was performed both before and after the administration of intravenous contrast. Heavily T2-weighted images of the biliary and pancreatic ducts were obtained, and three-dimensional MRCP images were rendered by post processing. CONTRAST:  10mL GADAVIST GADOBUTROL 1 MMOL/ML IV SOLN COMPARISON:  None Available. FINDINGS: Lower chest: No acute  findings. Hepatobiliary: Hepatic parenchyma demonstrates normal signal intensity. No focal intrahepatic masses are identified. No intrahepatic biliary ductal dilation. The portal vein hepatic arteries, and hepatic veins are patent. The gallbladder is contracted. Mild edema of the gallbladder wall noted, however, no superimposed pericholecystic inflammatory changes are identified. There are tiny gallstones identified within the gallbladder fundus as well as a single gallstone seen within the gallbladder neck. The extrahepatic bile duct is not dilated. Tiny filling defects are identified within the distal duct measuring up to 2 mm in size in keeping with tiny stones or debris and in keeping with choledocholithiasis. Pancreas: Normal parenchymal signal characteristics. No pancreatic mass identified. Scattered side branch ectasia noted within the uncinate process and body of the pancreas possibly the sequela remote inflammation. No superimposed peripancreatic inflammatory changes are identified. No peripancreatic fluid collections or lymphadenopathy. Spleen:  Within normal limits in size and appearance. Adrenals/Urinary Tract: The adrenal glands are unremarkable. The kidneys are normal in size and position. Mild bilateral nonspecific perinephric edema. Simple cortical cyst noted within the lower pole the right kidney for which no follow-up imaging is recommended. The kidneys are otherwise unremarkable. Stomach/Bowel: Visualized portions within the abdomen are unremarkable. Vascular/Lymphatic: No pathologically enlarged lymph nodes identified. No abdominal aortic aneurysm demonstrated. Other:  None. Musculoskeletal: No suspicious bone lesions identified. IMPRESSION: 1. Cholelithiasis. Mild edema of the gallbladder wall, without superimposed pericholecystic inflammatory change. 2. Tiny filling defects within the distal extrahepatic bile duct measuring up to 2 mm in size in keeping with tiny stones or debris and in keeping  with choledocholithiasis. No associated biliary ductal dilation. 3. Scattered side branch ectasia within the uncinate process and body of the pancreas possibly the sequela remote inflammation. No superimposed peripancreatic inflammatory changes are identified. Electronically Signed   By: Helyn Numbers M.D.   On: 10/01/2023 03:47   MR 3D Recon At Scanner Result Date: 10/01/2023 CLINICAL DATA:  Elevated liver function tests EXAM: MRI ABDOMEN WITHOUT AND WITH CONTRAST (INCLUDING MRCP) TECHNIQUE: Multiplanar multisequence MR imaging of the abdomen was performed both before and after the administration of intravenous contrast. Heavily T2-weighted images of the biliary and pancreatic ducts were obtained, and three-dimensional MRCP images were rendered by post processing. CONTRAST:  10mL GADAVIST GADOBUTROL 1 MMOL/ML IV SOLN COMPARISON:  None Available. FINDINGS: Lower chest: No acute findings. Hepatobiliary: Hepatic parenchyma demonstrates normal signal intensity. No focal intrahepatic masses are identified. No intrahepatic biliary ductal dilation. The portal vein hepatic arteries, and hepatic veins are patent. The gallbladder is contracted. Mild edema of the gallbladder wall noted, however, no superimposed pericholecystic inflammatory changes are identified. There are tiny gallstones identified within the gallbladder fundus as well as a single gallstone seen within the gallbladder neck. The extrahepatic bile duct is not dilated. Tiny filling defects are identified within the distal duct measuring up to 2 mm in size in keeping with tiny stones or debris and in keeping with choledocholithiasis. Pancreas: Normal parenchymal signal characteristics. No pancreatic mass identified. Scattered side branch ectasia noted within the uncinate process and body of the pancreas possibly the sequela remote inflammation. No superimposed peripancreatic inflammatory changes are identified. No peripancreatic fluid collections or  lymphadenopathy. Spleen:  Within normal limits in size and appearance. Adrenals/Urinary Tract: The adrenal glands are unremarkable. The kidneys are normal in size and position. Mild bilateral nonspecific perinephric edema. Simple cortical cyst noted within the lower pole the right kidney for which no follow-up imaging is recommended. The kidneys are otherwise unremarkable. Stomach/Bowel: Visualized portions within the abdomen are unremarkable. Vascular/Lymphatic: No pathologically enlarged lymph nodes identified. No abdominal aortic aneurysm demonstrated. Other:  None. Musculoskeletal: No suspicious bone lesions identified. IMPRESSION: 1. Cholelithiasis. Mild edema of the gallbladder wall, without superimposed pericholecystic inflammatory change. 2. Tiny filling defects within the distal extrahepatic bile duct measuring up to 2 mm in size in keeping with tiny stones or debris and in keeping with choledocholithiasis. No associated biliary ductal dilation. 3. Scattered side branch ectasia within the uncinate process and body of the pancreas possibly the sequela remote inflammation. No superimposed peripancreatic inflammatory changes are identified. Electronically Signed   By: Helyn Numbers M.D.   On: 10/01/2023 03:47   CT ABDOMEN PELVIS W CONTRAST Result Date: 09/30/2023 CLINICAL DATA:  Abdominal pain, acute, nonlocalized EXAM: CT ABDOMEN AND PELVIS WITH CONTRAST TECHNIQUE: Multidetector CT imaging of the abdomen and pelvis was performed using the standard protocol following bolus administration of intravenous contrast. RADIATION DOSE REDUCTION: This exam was performed according to the departmental dose-optimization program which includes automated exposure control, adjustment of the mA and/or kV according to patient size and/or use of iterative reconstruction technique. CONTRAST:  OMNIPAQUE IOHEXOL 300 MG/ML  SOLN COMPARISON:  Ultrasound abdomen 09/30/2023 trauma CT abdomen pelvis 12/13/2012 FINDINGS: Lower  chest: No acute abnormality.  Coronary artery calcification. Hepatobiliary: Diffusely hypodense hepatic parenchyma compared to the spleen consistent with hepatic steatosis. No focal lesion. Partially contracted gallbladder with mild gallbladder wall thickening. No CT evidence of radiopaque gallstones or pericholecystic fluid. No biliary dilatation. Pancreas: No focal lesion. Normal pancreatic contour. No surrounding inflammatory changes. No main pancreatic ductal dilatation. Spleen: Normal in size without focal abnormality. Adrenals/Urinary Tract: No adrenal nodule bilaterally. Bilateral kidneys enhance symmetrically. Fluid dense lesion likely represents a simple renal cyst. Simple renal cysts, in the absence of clinically indicated signs/symptoms, require no independent follow-up. No hydronephrosis. No hydroureter.  No nephroureterolithiasis. Mild circumferential urinary bladder wall thickening with query mild perivesicular fat stranding. On delayed imaging, there is no urothelial wall thickening and there are no filling defects in the opacified portions of the bilateral collecting systems or ureters. Stomach/Bowel: Stomach is within normal limits. No evidence of bowel wall thickening or dilatation. Colonic diverticulosis. Appendix appears normal. Vascular/Lymphatic: No abdominal aorta or iliac aneurysm. Moderate atherosclerotic plaque of the aorta and  its branches. No abdominal, pelvic, or inguinal lymphadenopathy. Reproductive: Prostate is unremarkable. Other: No intraperitoneal free fluid. No intraperitoneal free gas. No organized fluid collection. Musculoskeletal: No abdominal wall hernia or abnormality. No suspicious lytic or blastic osseous lesions. No acute displaced fracture. Multilevel degenerative changes of the spine. Diffusely decreased bone density. IMPRESSION: 1. Question mild cystitis.  Correlate with urinalysis. 2. Nonspecific mild gallbladder wall thickening possibly due to partial gallbladder  contraction. Correlate with ultrasound abdomen 09/30/2023. 3. Hepatic steatosis. 4. Colonic diverticulosis with no acute diverticulitis. 5. Diffusely decreased bone density. Electronically Signed   By: Tish Frederickson M.D.   On: 09/30/2023 20:03   US Abdomen Limited RUQ (LIVER/GB) Result Date: 09/30/2023 CLINICAL DATA:  [id number] Liver disorder 100251. Elevated liver function tests. Jaundice. Fatigue EXAM: ULTRASOUND ABDOMEN LIMITED RIGHT UPPER QUADRANT COMPARISON:  CT abdomen pelvis 09/29/2022, CT stone 12/13/2012 FINDINGS: Gallbladder: Question partially contracted gallbladder. Cholelithiasis with gallbladder wall thickening. No pericholecystic visualized. No sonographic Murphy sign noted by sonographer. Common bile duct: Diameter: 6 mm Liver: No focal lesion identified. Increased parenchymal echogenicity. Portal vein is patent on color Doppler imaging with normal direction of blood flow towards the liver. Other: None. IMPRESSION: 1. Cholelithiasis with nonspecific gallbladder wall thickening. Gallbladder thickening may be due to partially contracted gallbladder. 2. Hepatic steatosis. Please note limited evaluation for focal hepatic masses in a patient with hepatic steatosis due to decreased penetration of the acoustic ultrasound waves. Electronically Signed   By: Tish Frederickson M.D.   On: 09/30/2023 19:57    Assessment and Plan: #Choledocholithiasis MRCP with filing defects of distal extrahepatic bile duct indicative of small stones or debris. AST/ALT 98/216, Tbili 10.7. Imaging without evidence of cholangitis, or intra-abdominal infection. Afebrile.No Leukocytosis - PRN analgesia - PRN antiemetics - IV fluid hydration - Clear liquid diet - GI consulted, planning for ERCP on Monday 2/17 with Dr Servando Snare  #Hypertension Uncontrolled, BP as high as 152/115 in ED - Continue home losartan and amlodipine  #Hyperlipidemia - Not on outpatient meds  Advance Care Planning:   Code Status: Full Code    Consults: Gastroenterology  Family Communication: Wife at bedside  Severity of Illness: The appropriate patient status for this patient is INPATIENT. Inpatient status is judged to be reasonable and necessary in order to provide the required intensity of service to ensure the patient's safety. The patient's presenting symptoms, physical exam findings, and initial radiographic and laboratory data in the context of their chronic comorbidities is felt to place them at high risk for further clinical deterioration. Furthermore, it is not anticipated that the patient will be medically stable for discharge from the hospital within 2 midnights of admission.   * I certify that at the point of admission it is my clinical judgment that the patient will require inpatient hospital care spanning beyond 2 midnights from the point of admission due to high intensity of service, high risk for further deterioration and high frequency of surveillance required.*  To reach the provider On-Call:   7AM- 7PM see care teams to locate the attending and reach out to them via [email or web address]. Password: [email or web address] 7PM-7AM contact night-coverage If you still have difficulty reaching the appropriate provider, please page the [facility] (Director on Call) for [facility] on amion for assistance  This document was prepared using Conservation officer, historic buildings and may include unintentional dictation errors.  Bishop Limbo FNP-BC, PMHNP-BC Nurse Practitioner [facility]

## 2023-10-01 NOTE — Consult Note (Signed)
 GI Inpatient Consult Note  Reason for Consult: Choledocholithiasis    Attending Requesting Consult: Dr. Baxter Hire Ward, DO  History of Present Illness: JASUN GASPARINI is a 63 y.o. male seen for evaluation of choledocholithiasis at the request of ED physician - Dr. Rochele Raring. Patient has a PMH of obesity (BMI 36), HTN, HLD, gout, and BPH. He presented to the [facility] ED yesterday evening at the advise of his primary team after outpatient labs yesterday afternoon showed elevated LFTs concerning for cholestasis. He has been complaining of 3 week history of intermittent right posterior back pain with associated non-bloody and non-bilious emesis. Labs yesterday afternoon showed mixed pattern elevated LFTs - AST 98, ALT 212, alk phos 210, and total bilirubin 9.1 with direct component 6.3. Upon presentation to the ED, he was hypertensive and otherwise normal vital signs. Labs showed normal WBC 7K, negative lipase 36, and elevated LFTs with AST 98, ALT 216, alk phos 191, and total bilirubin 10.7. RUQ [place] commented on cholelithiasis with nonspecific gallbladder wall thickening and hepatic steatosis. CT abd/pelvis with contrast questioned mild cystitis and showed nonspecific mild gallbladder wall thickening 2/2 partial gallbladder contraction. MRCP performed overnight commented on tiny filling defects within the distal extrahepatic bile duct measuring up to 2 mm c/w diagnosis of choledocholithiasis without associated biliary ductal dilatation. He was admitted to the floor. GI consulted for further evaluation and management.   Patient seen and examined this morning resting comfortably in ED stretcher. No acute events or concerns since being here. His wife is present in room with him. He reports he has had similar episodes of back pain, NBNB emesis, and chills last year but didn't seek out medication evaluation at this time. He denies any abdominal pain, fevers, or chills at this time. He reports his urine started to  turn orange on Thursday this week. He denies any EtOH use. He has been taking ibuprofen 600 mg once daily over the past week due to the back pain. No changes in his bowel habits. He denies any bloody or tarry stools.    Summary of GI Procedures:  CSY 05/08/2012 - three 3-5 mm polyps removed from descending colon, diverticulosis in ascending, descending, and sigmoid colon with path showing benign colonic mucosa with lymphoid aggregates   Past Medical History:  Past Medical History:  Diagnosis Date   BPH (benign prostatic hypertrophy) 2016   s/p photovaporization Evelene Croon)   Diverticulosis 04/2012   GERD (gastroesophageal reflux disease)    Gout 08/1996   History of hiatal hernia 08/1996   Barium Swallow HH, Mod . Size   Hyperlipidemia 12/1998   Nephrolithiasis 2014   stent placed, then removed   Self-catheterizes urinary bladder    2-3 times/day    Problem List: Patient Active Problem List   Diagnosis Date Noted   Choledocholithiasis 10/01/2023   Jaundice 09/30/2023   Back pain 09/30/2023   Nausea & vomiting 09/30/2023   Severe obesity (BMI 35.0-39.9) with comorbidity (HCC) 01/29/2022   Exposure to silica 09/06/2018   HTN (hypertension) 08/26/2016   Benign prostatic hyperplasia    PVC (premature ventricular contraction) 05/31/2012   Healthcare maintenance 03/03/2012   HLD (hyperlipidemia) 08/28/2007   Gout 08/28/2007   ECZEMA 08/28/2007   Hiatal hernia with GERD 08/23/2007    Past Surgical History: Past Surgical History:  Procedure Laterality Date   COLONOSCOPY  04/2012   mod diverticulosis, 3 polyps - benign, rec rpt 10 yrs (Pyrtle)   CYSTECTOMY  1994   cystectomy right foot  and gluteal area   CYSTOSCOPY WITH LITHOLAPAXY  06/03/2015   Procedure: CYSTOSCOPY WITH LITHOLAPAXY WITH HOLMIUM LASER ;  Surgeon: Orson Ape, MD;  Location: [facility] ORS;  Service: Urology;;   GREEN LIGHT LASER TURP (TRANSURETHRAL RESECTION OF PROSTATE N/A 06/03/2015   Procedure: GREEN LIGHT LASER  TURP (TRANSURETHRAL RESECTION OF PROSTATE;  Surgeon: Orson Ape, MD;  Location: [facility] ORS;  Service: Urology;  Laterality: N/A;   LITHOTRIPSY  1999   LITHOTRIPSY  2014   with stent placement, s/p removal Evelene Croon)   stress echocardiogram  05/2015   overall normal after max exercise, severe hypertensive response, EF 60-65%   WRIST GANGLION EXCISION      Allergies: No Known Allergies  Home Medications: Medications Prior to Admission  Medication Sig Dispense Refill Last Dose/Taking   allopurinol (ZYLOPRIM) 300 MG tablet Take 0.5 tablets (150 mg total) by mouth daily. 45 tablet 4    amLODipine (NORVASC) 5 MG tablet Take 1 tablet (5 mg total) by mouth daily. 90 tablet 4    fluticasone (FLONASE) 50 MCG/ACT nasal spray Place 2 sprays into both nostrils daily. 16 g 6    losartan (COZAAR) 25 MG tablet Take 1 tablet (25 mg total) by mouth daily. 90 tablet 4    omeprazole (PRILOSEC OTC) 20 MG tablet Take 20 mg by mouth every morning.       Home medication reconciliation was completed with the patient.   Scheduled Inpatient Medications:    amLODipine  5 mg Oral Daily   fluticasone  2 spray Each Nare Daily   losartan  25 mg Oral Daily   pantoprazole  40 mg Oral Daily    Continuous Inpatient Infusions:    sodium chloride 75 mL/hr at 10/01/23 0728    PRN Inpatient Medications:  acetaminophen **OR** acetaminophen, albuterol, HYDROcodone-acetaminophen, morphine injection, ondansetron (ZOFRAN) IV  Family History: family history includes CAD (age of onset: 26) in his paternal grandfather; Diabetes in his father; Hypertension in his father, mother, and sister; Kidney disease in his father.  The patient's family history is negative for inflammatory bowel disorders, GI malignancy, or solid organ transplantation.  Social History:   reports that he has never smoked. His smokeless tobacco use includes chew. He reports current alcohol use. He reports that he does not use drugs. The patient denies  ETOH, tobacco, or drug use.   Review of Systems: Constitutional: Weight is stable.  Eyes: No changes in vision. ENT: No oral lesions, sore throat.  GI: see HPI.  Heme/Lymph: No easy bruising.  CV: No chest pain.  GU: No hematuria.  Integumentary: No rashes.  Neuro: No headaches.  Psych: No depression/anxiety.  Endocrine: No heat/cold intolerance.  Allergic/Immunologic: No urticaria.  Resp: No cough, SOB.  Musculoskeletal: No joint swelling.    Physical Examination: BP (!) 143/101 (BP Location: Right Arm)   Pulse 69   Temp 98.4 F (36.9 C) (Oral)   Resp 18   SpO2 96%  Gen: NAD, alert and oriented x 4 HEENT: PEERLA, EOMI, +scleral icterus Neck: supple, no JVD or thyromegaly Chest: CTA bilaterally, no wheezes, crackles, or other adventitious sounds CV: RRR, no m/g/c/r Abd: soft, NT, ND, +BS in all four quadrants; no HSM, guarding, ridigity, or rebound tenderness Ext: no edema, well perfused with 2+ pulses, Skin: no rash or lesions noted Lymph: no LAD  Data: Lab Results  Component Value Date   WBC 7.0 09/30/2023   HGB 16.7 09/30/2023   HCT 50.0 09/30/2023   MCV 89.6 09/30/2023  PLT 198 09/30/2023   Recent Labs  Lab 09/30/23 1246 09/30/23 1741  HGB 16.2 16.7   Lab Results  Component Value Date   NA 137 09/30/2023   K 3.6 09/30/2023   CL 100 09/30/2023   CO2 23 09/30/2023   BUN 17 09/30/2023   CREATININE 0.71 09/30/2023   Lab Results  Component Value Date   ALT 216 (H) 09/30/2023   AST 98 (H) 09/30/2023   ALKPHOS 191 (H) 09/30/2023   BILITOT 10.7 (H) 09/30/2023   Recent Labs  Lab 10/01/23 0121  INR 1.1   RUQ [place] 09/30/2023: IMPRESSION: 1. Cholelithiasis with nonspecific gallbladder wall thickening. Gallbladder thickening may be due to partially contracted gallbladder. 2. Hepatic steatosis. Please note limited evaluation for focal hepatic masses in a patient with hepatic steatosis due to decreased penetration of the acoustic ultrasound  waves.  CT abd/pelvis with contrast 09/30/2023: IMPRESSION: 1. Question mild cystitis.  Correlate with urinalysis. 2. Nonspecific mild gallbladder wall thickening possibly due to partial gallbladder contraction. Correlate with ultrasound abdomen 09/30/2023. 3. Hepatic steatosis. 4. Colonic diverticulosis with no acute diverticulitis. 5. Diffusely decreased bone density.   MRCP 09/30/2023: IMPRESSION: 1. Cholelithiasis. Mild edema of the gallbladder wall, without superimposed pericholecystic inflammatory change. 2. Tiny filling defects within the distal extrahepatic bile duct measuring up to 2 mm in size in keeping with tiny stones or debris and in keeping with choledocholithiasis. No associated biliary ductal dilation. 3. Scattered side branch ectasia within the uncinate process and body of the pancreas possibly the sequela remote inflammation. No superimposed peripancreatic inflammatory changes are identified.  Assessment/Plan:  63 y/o Caucasian male with a PMH of obesity (BMI 36), HTN, HLD, gout, and BPH presented to the [facility] ED yesterday evening for chief complaint of elevated LFTs concerning for cholestasis. Labs and imaging c/w diagnosis of choledocholithiasis. GI consulted for further evaluation and management.   Choledocholithiasis  Elevated LFTs - mixed pattern, primarily cholestasis 2/2 #1  Obesity - BMI 36  Recommendations:  - Clinical presentation, labs, and radiographic findings c/w choledocholithiasis  - No evidence of acute cholecystitis or ascending cholangitis - No indications for antibiotics at this time  - Continue supportive care per primary team with pain control, IV fluid hydration, and antiemetics PRN - Continue serial abdominal examinations and mental status checks. Monitor for fevers and altered mental status.  - Recommend ERCP for stone extraction. Discussed procedure details and indications with patient and wife in room this morning. We reviewed  potential risks of bleeding, infection, small puncture to internal organs, or post-ERCP pancreatitis. Patient consents to proceed.  - Plan for ERCP on Monday AM with Dr. Servando Snare. Time TBD based on endoscopy suite availability and Dr. Annabell Sabal schedule.  - Dr. Norma Fredrickson and myself will continue to follow along - CLD for now. Can ADAT. Will need to be NPO Sunday midnight.   I reviewed the risks (including bleeding, perforation, infection, anesthesia complications, cardiac/respiratory complications, post-ERCP pancreatitis), benefits and alternatives of ERCP. Patient consents to proceed.    Thank you for the consult. Please call with questions or concerns.  Gilda Crease, PA-C [facility] [phone]

## 2023-10-01 NOTE — Plan of Care (Signed)

## 2023-10-01 NOTE — ED Provider Notes (Signed)
 [facility] Provider Note    Event Date/Time   First MD Initiated Contact with Patient 10/01/23 [phone]     (approximate)   History   No chief complaint on file.   HPI  Timothy Landry is a 63 y.o. male with history of hyperlipidemia, kidney stones, GERD who presents to the emergency department with complaints of intermittent right posterior back pain with nausea and vomiting.  States the symptoms have been ongoing for about 3 weeks.  He states over the past few days he noticed that his urine appeared very dark in color so he went to see his PCP.  He had blood work that showed significantly elevated liver function test and was sent to the ED.  He denies any acetaminophen use, regular alcohol use.  He still has his gallbladder.  No known history of liver disease.  He is not having any pain currently.  No fevers.   History provided by patient, family.    Past Medical History:  Diagnosis Date   BPH (benign prostatic hypertrophy) 2016   s/p photovaporization Evelene Croon)   Diverticulosis 04/2012   GERD (gastroesophageal reflux disease)    Gout 08/1996   History of hiatal hernia 08/1996   Barium Swallow HH, Mod . Size   Hyperlipidemia 12/1998   Nephrolithiasis 2014   stent placed, then removed   Self-catheterizes urinary bladder    2-3 times/day    Past Surgical History:  Procedure Laterality Date   COLONOSCOPY  04/2012   mod diverticulosis, 3 polyps - benign, rec rpt 10 yrs (Pyrtle)   CYSTECTOMY  1994   cystectomy right foot and gluteal area   CYSTOSCOPY WITH LITHOLAPAXY  06/03/2015   Procedure: CYSTOSCOPY WITH LITHOLAPAXY WITH HOLMIUM LASER ;  Surgeon: Orson Ape, MD;  Location: [facility] ORS;  Service: Urology;;   GREEN LIGHT LASER TURP (TRANSURETHRAL RESECTION OF PROSTATE N/A 06/03/2015   Procedure: GREEN LIGHT LASER TURP (TRANSURETHRAL RESECTION OF PROSTATE;  Surgeon: Orson Ape, MD;  Location: [facility] ORS;  Service: Urology;  Laterality: N/A;    LITHOTRIPSY  1999   LITHOTRIPSY  2014   with stent placement, s/p removal Evelene Croon)   stress echocardiogram  05/2015   overall normal after max exercise, severe hypertensive response, EF 60-65%   WRIST GANGLION EXCISION      MEDICATIONS:  Prior to Admission medications   Medication Sig Start Date End Date Taking? Authorizing Provider  allopurinol (ZYLOPRIM) 300 MG tablet Take 0.5 tablets (150 mg total) by mouth daily. 02/25/23   Eustaquio Boyden, MD  amLODipine (NORVASC) 5 MG tablet Take 1 tablet (5 mg total) by mouth daily. 02/25/23   Eustaquio Boyden, MD  fluticasone [facility]) 50 MCG/ACT nasal spray Place 2 sprays into both nostrils daily. 01/29/22   Eustaquio Boyden, MD  losartan (COZAAR) 25 MG tablet Take 1 tablet (25 mg total) by mouth daily. 02/25/23   Eustaquio Boyden, MD  omeprazole (PRILOSEC OTC) 20 MG tablet Take 20 mg by mouth every morning.     [provider]    Physical Exam   Triage Vital Signs: ED Triage Vitals  Encounter Vitals Group     BP 09/30/23 1740 (!) 152/115     Systolic BP Percentile --      Diastolic BP Percentile --      Pulse Rate 09/30/23 1740 81     Resp 09/30/23 1740 17     Temp 09/30/23 1740 98 F (36.7 C)     Temp Source 09/30/23  1740 Oral     SpO2 09/30/23 1740 95 %     Weight --      Height --      Head Circumference --      Peak Flow --      Pain Score 09/30/23 1739 0     Pain Loc --      Pain Education --      Exclude from Growth Chart --     Most recent vital signs: Vitals:   10/01/23 0727 10/01/23 0800  BP: (!) 159/94 (!) 141/106  Pulse: 64 67  Resp: 17   Temp:    SpO2: 92% 91%    CONSTITUTIONAL: Alert, responds appropriately to questions. Well-appearing; well-nourished HEAD: Normocephalic, atraumatic EYES: Conjunctivae clear, pupils appear equal, scleral icterus ENT: normal nose; moist mucous membranes NECK: Supple, normal ROM CARD: RRR; S1 and S2 appreciated RESP: Normal chest excursion without splinting or  tachypnea; breath sounds clear and equal bilaterally; no wheezes, no rhonchi, no rales, no hypoxia or respiratory distress, speaking full sentences ABD/GI: Non-distended; soft, non-tender, no rebound, no guarding, no peritoneal signs, negative Murphy sign BACK: The back appears normal EXT: Normal ROM in all joints; no deformity noted, no edema SKIN: Mild jaundice; warm; no rash on exposed skin NEURO: Moves all extremities equally, normal speech PSYCH: The patient's mood and manner are appropriate.   ED Results / Procedures / Treatments   LABS: (all labs ordered are listed, but only abnormal results are displayed) Labs Reviewed  COMPREHENSIVE METABOLIC PANEL - Abnormal; Notable for the following components:      Result Value   Glucose, Bld 111 (*)    AST 98 (*)    ALT 216 (*)    Alkaline Phosphatase 191 (*)    Total Bilirubin 10.7 (*)    All other components within normal limits  CBC WITH DIFFERENTIAL/PLATELET  AMMONIA  LIPASE, BLOOD  PROTIME-INR  HEPATITIS PANEL, ACUTE     EKG:     RADIOLOGY: My personal review and interpretation of imaging: Imaging shows gallstones, possible choledocholithiasis.  I have personally reviewed all radiology reports.   MR ABDOMEN MRCP W WO CONTAST Result Date: 10/01/2023 CLINICAL DATA:  Elevated liver function tests EXAM: MRI ABDOMEN WITHOUT AND WITH CONTRAST (INCLUDING MRCP) TECHNIQUE: Multiplanar multisequence MR imaging of the abdomen was performed both before and after the administration of intravenous contrast. Heavily T2-weighted images of the biliary and pancreatic ducts were obtained, and three-dimensional MRCP images were rendered by post processing. CONTRAST:  10mL GADAVIST GADOBUTROL 1 MMOL/ML IV SOLN COMPARISON:  None Available. FINDINGS: Lower chest: No acute findings. Hepatobiliary: Hepatic parenchyma demonstrates normal signal intensity. No focal intrahepatic masses are identified. No intrahepatic biliary ductal dilation. The portal  vein hepatic arteries, and hepatic veins are patent. The gallbladder is contracted. Mild edema of the gallbladder wall noted, however, no superimposed pericholecystic inflammatory changes are identified. There are tiny gallstones identified within the gallbladder fundus as well as a single gallstone seen within the gallbladder neck. The extrahepatic bile duct is not dilated. Tiny filling defects are identified within the distal duct measuring up to 2 mm in size in keeping with tiny stones or debris and in keeping with choledocholithiasis. Pancreas: Normal parenchymal signal characteristics. No pancreatic mass identified. Scattered side branch ectasia noted within the uncinate process and body of the pancreas possibly the sequela remote inflammation. No superimposed peripancreatic inflammatory changes are identified. No peripancreatic fluid collections or lymphadenopathy. Spleen:  Within normal limits in size and appearance. Adrenals/Urinary  Tract: The adrenal glands are unremarkable. The kidneys are normal in size and position. Mild bilateral nonspecific perinephric edema. Simple cortical cyst noted within the lower pole the right kidney for which no follow-up imaging is recommended. The kidneys are otherwise unremarkable. Stomach/Bowel: Visualized portions within the abdomen are unremarkable. Vascular/Lymphatic: No pathologically enlarged lymph nodes identified. No abdominal aortic aneurysm demonstrated. Other:  None. Musculoskeletal: No suspicious bone lesions identified. IMPRESSION: 1. Cholelithiasis. Mild edema of the gallbladder wall, without superimposed pericholecystic inflammatory change. 2. Tiny filling defects within the distal extrahepatic bile duct measuring up to 2 mm in size in keeping with tiny stones or debris and in keeping with choledocholithiasis. No associated biliary ductal dilation. 3. Scattered side branch ectasia within the uncinate process and body of the pancreas possibly the sequela remote  inflammation. No superimposed peripancreatic inflammatory changes are identified. Electronically Signed   By: Helyn Numbers M.D.   On: 10/01/2023 03:47   MR 3D Recon At Scanner Result Date: 10/01/2023 CLINICAL DATA:  Elevated liver function tests EXAM: MRI ABDOMEN WITHOUT AND WITH CONTRAST (INCLUDING MRCP) TECHNIQUE: Multiplanar multisequence MR imaging of the abdomen was performed both before and after the administration of intravenous contrast. Heavily T2-weighted images of the biliary and pancreatic ducts were obtained, and three-dimensional MRCP images were rendered by post processing. CONTRAST:  10mL GADAVIST GADOBUTROL 1 MMOL/ML IV SOLN COMPARISON:  None Available. FINDINGS: Lower chest: No acute findings. Hepatobiliary: Hepatic parenchyma demonstrates normal signal intensity. No focal intrahepatic masses are identified. No intrahepatic biliary ductal dilation. The portal vein hepatic arteries, and hepatic veins are patent. The gallbladder is contracted. Mild edema of the gallbladder wall noted, however, no superimposed pericholecystic inflammatory changes are identified. There are tiny gallstones identified within the gallbladder fundus as well as a single gallstone seen within the gallbladder neck. The extrahepatic bile duct is not dilated. Tiny filling defects are identified within the distal duct measuring up to 2 mm in size in keeping with tiny stones or debris and in keeping with choledocholithiasis. Pancreas: Normal parenchymal signal characteristics. No pancreatic mass identified. Scattered side branch ectasia noted within the uncinate process and body of the pancreas possibly the sequela remote inflammation. No superimposed peripancreatic inflammatory changes are identified. No peripancreatic fluid collections or lymphadenopathy. Spleen:  Within normal limits in size and appearance. Adrenals/Urinary Tract: The adrenal glands are unremarkable. The kidneys are normal in size and position. Mild  bilateral nonspecific perinephric edema. Simple cortical cyst noted within the lower pole the right kidney for which no follow-up imaging is recommended. The kidneys are otherwise unremarkable. Stomach/Bowel: Visualized portions within the abdomen are unremarkable. Vascular/Lymphatic: No pathologically enlarged lymph nodes identified. No abdominal aortic aneurysm demonstrated. Other:  None. Musculoskeletal: No suspicious bone lesions identified. IMPRESSION: 1. Cholelithiasis. Mild edema of the gallbladder wall, without superimposed pericholecystic inflammatory change. 2. Tiny filling defects within the distal extrahepatic bile duct measuring up to 2 mm in size in keeping with tiny stones or debris and in keeping with choledocholithiasis. No associated biliary ductal dilation. 3. Scattered side branch ectasia within the uncinate process and body of the pancreas possibly the sequela remote inflammation. No superimposed peripancreatic inflammatory changes are identified. Electronically Signed   By: Helyn Numbers M.D.   On: 10/01/2023 03:47   CT ABDOMEN PELVIS W CONTRAST Result Date: 09/30/2023 CLINICAL DATA:  Abdominal pain, acute, nonlocalized EXAM: CT ABDOMEN AND PELVIS WITH CONTRAST TECHNIQUE: Multidetector CT imaging of the abdomen and pelvis was performed using the standard protocol following bolus administration of  intravenous contrast. RADIATION DOSE REDUCTION: This exam was performed according to the departmental dose-optimization program which includes automated exposure control, adjustment of the mA and/or kV according to patient size and/or use of iterative reconstruction technique. CONTRAST:  OMNIPAQUE IOHEXOL 300 MG/ML  SOLN COMPARISON:  Ultrasound abdomen 09/30/2023 trauma CT abdomen pelvis 12/13/2012 FINDINGS: Lower chest: No acute abnormality.  Coronary artery calcification. Hepatobiliary: Diffusely hypodense hepatic parenchyma compared to the spleen consistent with hepatic steatosis. No focal  lesion. Partially contracted gallbladder with mild gallbladder wall thickening. No CT evidence of radiopaque gallstones or pericholecystic fluid. No biliary dilatation. Pancreas: No focal lesion. Normal pancreatic contour. No surrounding inflammatory changes. No main pancreatic ductal dilatation. Spleen: Normal in size without focal abnormality. Adrenals/Urinary Tract: No adrenal nodule bilaterally. Bilateral kidneys enhance symmetrically. Fluid dense lesion likely represents a simple renal cyst. Simple renal cysts, in the absence of clinically indicated signs/symptoms, require no independent follow-up. No hydronephrosis. No hydroureter.  No nephroureterolithiasis. Mild circumferential urinary bladder wall thickening with query mild perivesicular fat stranding. On delayed imaging, there is no urothelial wall thickening and there are no filling defects in the opacified portions of the bilateral collecting systems or ureters. Stomach/Bowel: Stomach is within normal limits. No evidence of bowel wall thickening or dilatation. Colonic diverticulosis. Appendix appears normal. Vascular/Lymphatic: No abdominal aorta or iliac aneurysm. Moderate atherosclerotic plaque of the aorta and its branches. No abdominal, pelvic, or inguinal lymphadenopathy. Reproductive: Prostate is unremarkable. Other: No intraperitoneal free fluid. No intraperitoneal free gas. No organized fluid collection. Musculoskeletal: No abdominal wall hernia or abnormality. No suspicious lytic or blastic osseous lesions. No acute displaced fracture. Multilevel degenerative changes of the spine. Diffusely decreased bone density. IMPRESSION: 1. Question mild cystitis.  Correlate with urinalysis. 2. Nonspecific mild gallbladder wall thickening possibly due to partial gallbladder contraction. Correlate with ultrasound abdomen 09/30/2023. 3. Hepatic steatosis. 4. Colonic diverticulosis with no acute diverticulitis. 5. Diffusely decreased bone density.  Electronically Signed   By: Tish Frederickson M.D.   On: 09/30/2023 20:03   US Abdomen Limited RUQ (LIVER/GB) Result Date: 09/30/2023 CLINICAL DATA:  [id number] Liver disorder 100251. Elevated liver function tests. Jaundice. Fatigue EXAM: ULTRASOUND ABDOMEN LIMITED RIGHT UPPER QUADRANT COMPARISON:  CT abdomen pelvis 09/29/2022, CT stone 12/13/2012 FINDINGS: Gallbladder: Question partially contracted gallbladder. Cholelithiasis with gallbladder wall thickening. No pericholecystic visualized. No sonographic Murphy sign noted by sonographer. Common bile duct: Diameter: 6 mm Liver: No focal lesion identified. Increased parenchymal echogenicity. Portal vein is patent on color Doppler imaging with normal direction of blood flow towards the liver. Other: None. IMPRESSION: 1. Cholelithiasis with nonspecific gallbladder wall thickening. Gallbladder thickening may be due to partially contracted gallbladder. 2. Hepatic steatosis. Please note limited evaluation for focal hepatic masses in a patient with hepatic steatosis due to decreased penetration of the acoustic ultrasound waves. Electronically Signed   By: Tish Frederickson M.D.   On: 09/30/2023 19:57     PROCEDURES:  Critical Care performed: No    Procedures    IMPRESSION / MDM / ASSESSMENT AND PLAN / ED COURSE  I reviewed the triage vital signs and the nursing notes.    Patient here with intermittent upper back pain, vomiting and now with dark urine found to have further function test with total bili of 10.7.  The patient is on the cardiac monitor to evaluate for evidence of arrhythmia and/or significant heart rate changes.   DIFFERENTIAL DIAGNOSIS (includes but not limited to):   Gallstones, cholecystitis, choledocholithiasis, gallstone pancreatitis, hepatitis, liver failure, malignancy  Patient's presentation is most consistent with acute presentation with potential threat to life or bodily function.   PLAN: Workup initiated from triage.  No  leukocytosis.  LFTs show AST of 98, ALT 216, alkaline phosphatase 191, total bilirubin 10.7.  Lipase is normal.  Ammonia level negative.  Will obtain INR, hepatitis panel.  CT of the abdomen pelvis and right upper quadrant ultrasound obtained from triage and reviewed/interpreted by myself and the radiologist.  He has mild gallbladder wall thickening but no pericholecystic fluid.  He does have gallstones and hepatic steatosis.  Will obtain MRCP to evaluate for choledocholithiasis.  He has no abdominal tenderness currently on exam.   MEDICATIONS GIVEN IN ED: Medications  0.9 %  sodium chloride infusion ( Intravenous New Bag/Given 10/01/23 0728)  morphine (PF) 4 MG/ML injection 4 mg (has no administration in time range)  ondansetron (ZOFRAN) injection 4 mg (has no administration in time range)  acetaminophen (TYLENOL) tablet 650 mg (has no administration in time range)    Or  acetaminophen (TYLENOL) suppository 650 mg (has no administration in time range)  HYDROcodone-acetaminophen (NORCO/VICODIN) 5-325 MG per tablet 1-2 tablet (has no administration in time range)  albuterol (PROVENTIL) (2.5 MG/3ML) 0.083% nebulizer solution 2.5 mg (has no administration in time range)  iohexol (OMNIPAQUE) 300 MG/ML solution 100 mL (100 mLs Intravenous Contrast Given 09/30/23 1826)  sodium chloride 0.9 % bolus 1,000 mL (0 mLs Intravenous Stopped 10/01/23 0157)  gadobutrol (GADAVIST) 1 MMOL/ML injection 10 mL (10 mLs Intravenous Contrast Given 10/01/23 0243)     ED COURSE: MRCP reviewed and interpreted by myself and the radiologist and shows debris versus stones in the distal extrahepatic bile duct.  No cholecystitis.  Discussed with Dr. Norma Fredrickson with gastroenterology.  Appreciate his help.  He states that Dr. Servando Snare would potentially be available on Monday and patient could stay here at [facility] given he is hemodynamically stable without signs or symptoms currently of cholangitis.  Will keep n.p.o. and continue  fluids.  Will discuss with the hospitalist for admission.   CONSULTS:  Consulted and discussed patient's case with hospitalist, Dr. Para March.  I have recommended admission and consulting physician agrees and will place admission orders.  Patient (and family if present) agree with this plan.   I reviewed all nursing notes, vitals, pertinent previous records.  All labs, EKGs, imaging ordered have been independently reviewed and interpreted by myself.    OUTSIDE RECORDS REVIEWED: Reviewed recent PCP notes.       FINAL CLINICAL IMPRESSION(S) / ED DIAGNOSES   Final diagnoses:  Elevated liver function tests  Gallstones  Choledocholithiasis     Rx / DC Orders   ED Discharge Orders     None        Note:  This document was prepared using Dragon voice recognition software and may include unintentional dictation errors.   Shakeyla Giebler, Layla Maw, DO 10/01/23 0900

## 2023-10-02 DIAGNOSIS — K8050 Calculus of bile duct without cholangitis or cholecystitis without obstruction: Secondary | ICD-10-CM | POA: Diagnosis not present

## 2023-10-02 MED ORDER — ENOXAPARIN SODIUM 60 MG/0.6ML IJ SOSY
0.5000 mg/kg | PREFILLED_SYRINGE | INTRAMUSCULAR | Status: DC
  Administered 2023-10-02 – 2023-10-05 (×3): 55 mg via SUBCUTANEOUS
  Filled 2023-10-02 (×3): qty 0.6

## 2023-10-02 NOTE — Progress Notes (Signed)
  PROGRESS NOTE    Timothy Landry  UJW:[id number] DOB: [birth year] DOA: 09/30/2023 PCP: Eustaquio Boyden, MD  [id number]  LOS: 1 day   Brief hospital course:   Assessment & Plan: [name] is a 63 y.o. male with medical history significant of  HTN, HLD, BPH, gout. He presented to the [facility] ED yesterday evening after his PCP notified him of elevated LFTs on outpatient labs.     # Cholestasis 2/2 #Choledocholithiasis MRCP with filing defects of distal extrahepatic bile duct indicative of small stones or debris. AST/ALT 98/216, Tbili 10.7. Imaging without evidence of cholangitis, or intra-abdominal infection. Afebrile.No Leukocytosis --low fat diet --ERCP tomorrow --after ERCP, GenSurg to consider timing of cholecystectomy.     #Hypertension Uncontrolled, BP as high as 152/115 in ED --cont home amlodipine and losartan   #GERD --cont PPI   DVT prophylaxis: Lovenox SQ Code Status: Full code  Family Communication: family updated at bedside today Level of care: Med-Surg Dispo:   The patient is from: home Anticipated d/c is to: home Anticipated d/c date is: to be determined   Subjective and Interval History:  Pt reported no pain, no complaint.   Objective: Vitals:   10/01/23 2030 10/02/23 0408 10/02/23 0934 10/02/23 1617  BP: 134/89 (!) 137/90 (!) 158/102 (!) 136/95  Pulse: 63 64 65 (!) 59  Resp: 18 20 18 19   Temp: 98.3 F (36.8 C) 98.4 F (36.9 C) 98.1 F (36.7 C) 98 F (36.7 C)  TempSrc: Oral Oral  Oral  SpO2: 96% 95% 97% 96%  Weight:      Height:        Intake/Output Summary (Last 24 hours) at 10/02/2023 1726 Last data filed at 10/02/2023 1500 Gross per 24 hour  Intake 1859.95 ml  Output 1 ml  Net 1858.95 ml   Filed Weights   10/01/23 1308  Weight: 108 kg    Examination:   Constitutional: NAD, AAOx3 HEENT: conjunctivae and lids normal, EOMI CV: No cyanosis.   RESP: normal respiratory effort, on RA Neuro: II - XII grossly intact.   Psych:  Normal mood and affect.  Appropriate judgement and reason   Data Reviewed: I have personally reviewed labs and imaging studies  Time spent: 35 minutes  Darlin Priestly, MD [facility] If 7PM-7AM, please contact night-coverage 10/02/2023, 5:26 PM

## 2023-10-02 NOTE — Progress Notes (Signed)
 PHARMACIST - PHYSICIAN COMMUNICATION  CONCERNING:  Enoxaparin (Lovenox) for DVT Prophylaxis   RECOMMENDATION: Patient was prescribed enoxaprin 40mg  q24 hours for VTE prophylaxis.   Filed Weights   10/01/23 1308  Weight: 108 kg (238 lb 1.6 oz)   Body mass index is 37.29 kg/m.  Based on [facility] policy patient is candidate for enoxaparin 0.5mg /kg TBW SQ every 24 hours based on BMI being >30.  DESCRIPTION: Pharmacy has adjusted enoxaparin dose per [facility] policy.  Patient is now receiving enoxaparin 55 mg every 24 hours.   Littie Deeds, PharmD Pharmacy Resident  10/02/2023 7:59 AM

## 2023-10-02 NOTE — Progress Notes (Signed)
 [facility] Inpatient Progress Note    Subjective: Patient seen for follow up of choledocholithiasis, elevated LFT's. Patient continues to do well, complaining of no pain, nausea, vomiting or diaphoresis. Seems to rest well at night. Urine seems NOT as dark today, per patient.  Objective: Vital signs in last 24 hours: Temp:  [98.1 F (36.7 C)-98.4 F (36.9 C)] 98.1 F (36.7 C) (02/16 0934) Pulse Rate:  [63-69] 65 (02/16 0934) Resp:  [18-20] 18 (02/16 0934) BP: (134-158)/(89-102) 158/102 (02/16 0934) SpO2:  [95 %-97 %] 97 % (02/16 0934) Weight:  [108 kg] 108 kg (02/15 1308) Blood pressure (!) 158/102, pulse 65, temperature 98.1 F (36.7 C), resp. rate 18, height 5\' 7"  (1.702 m), weight 108 kg, SpO2 97%.    Intake/Output from previous day: 02/15 0701 - 02/16 0700 In: 1980 [P.O.:240; I.V.:1740] Out: 1 [Urine:1]  Intake/Output this shift: No intake/output data recorded.   Gen: NAD. Appears comfortable.  HEENT: [place]/AT. PERRLA. Normal external ear exam.  Chest: CTA, no wheezes.  CV: RR nl S1, S2. No gallops.  Abd: soft, nt, nd. BS+  Ext: no edema. Pulses 2+  Neuro: Alert and oriented. Judgement appears normal. Nonfocal.   Lab Results: No results found for this or any previous visit (from the past 24 hours).   Recent Labs    09/30/23 1246 09/30/23 1741  WBC 6.8 7.0  HGB 16.2 16.7  HCT 48.8 50.0  PLT 184.0 198   BMET Recent Labs    09/30/23 1246 09/30/23 1741  NA 135 137  K 3.8 3.6  CL 101 100  CO2 27 23  GLUCOSE 108* 111*  BUN 16 17  CREATININE 1.06 0.71  CALCIUM 8.7 9.0   LFT Recent Labs    09/30/23 1246 09/30/23 1741  PROT 7.4 7.9  ALBUMIN 4.2 4.1  AST 98* 98*  ALT 212* 216*  ALKPHOS 210* 191*  BILITOT 9.1* 10.7*  BILIDIR 6.3*  --    PT/INR Recent Labs    10/01/23 0121  LABPROT 14.4  INR 1.1   Hepatitis Panel Recent Labs    09/30/23 1741  HEPBSAG NON REACTIVE  HCVAB NON REACTIVE  HEPAIGM NON REACTIVE   HEPBIGM NON REACTIVE   C-Diff No results for input(s): "CDIFFTOX" in the last 72 hours. No results for input(s): "CDIFFPCR" in the last 72 hours.   Studies/Results: MR ABDOMEN MRCP W WO CONTAST Result Date: 10/01/2023 CLINICAL DATA:  Elevated liver function tests EXAM: MRI ABDOMEN WITHOUT AND WITH CONTRAST (INCLUDING MRCP) TECHNIQUE: Multiplanar multisequence MR imaging of the abdomen was performed both before and after the administration of intravenous contrast. Heavily T2-weighted images of the biliary and pancreatic ducts were obtained, and three-dimensional MRCP images were rendered by post processing. CONTRAST:  10mL GADAVIST GADOBUTROL 1 MMOL/ML IV SOLN COMPARISON:  None Available. FINDINGS: Lower chest: No acute findings. Hepatobiliary: Hepatic parenchyma demonstrates normal signal intensity. No focal intrahepatic masses are identified. No intrahepatic biliary ductal dilation. The portal vein hepatic arteries, and hepatic veins are patent. The gallbladder is contracted. Mild edema of the gallbladder wall noted, however, no superimposed pericholecystic inflammatory changes are identified. There are tiny gallstones identified within the gallbladder fundus as well as a single gallstone seen within the gallbladder neck. The extrahepatic bile duct is not dilated. Tiny filling defects are identified within the distal duct measuring up to 2 mm in size in keeping with tiny stones or debris and in keeping with choledocholithiasis. Pancreas: Normal parenchymal signal characteristics. No pancreatic mass identified. Scattered side  branch ectasia noted within the uncinate process and body of the pancreas possibly the sequela remote inflammation. No superimposed peripancreatic inflammatory changes are identified. No peripancreatic fluid collections or lymphadenopathy. Spleen:  Within normal limits in size and appearance. Adrenals/Urinary Tract: The adrenal glands are unremarkable. The kidneys are normal in size  and position. Mild bilateral nonspecific perinephric edema. Simple cortical cyst noted within the lower pole the right kidney for which no follow-up imaging is recommended. The kidneys are otherwise unremarkable. Stomach/Bowel: Visualized portions within the abdomen are unremarkable. Vascular/Lymphatic: No pathologically enlarged lymph nodes identified. No abdominal aortic aneurysm demonstrated. Other:  None. Musculoskeletal: No suspicious bone lesions identified. IMPRESSION: 1. Cholelithiasis. Mild edema of the gallbladder wall, without superimposed pericholecystic inflammatory change. 2. Tiny filling defects within the distal extrahepatic bile duct measuring up to 2 mm in size in keeping with tiny stones or debris and in keeping with choledocholithiasis. No associated biliary ductal dilation. 3. Scattered side branch ectasia within the uncinate process and body of the pancreas possibly the sequela remote inflammation. No superimposed peripancreatic inflammatory changes are identified. Electronically Signed   By: Helyn Numbers M.D.   On: 10/01/2023 03:47   MR 3D Recon At Scanner Result Date: 10/01/2023 CLINICAL DATA:  Elevated liver function tests EXAM: MRI ABDOMEN WITHOUT AND WITH CONTRAST (INCLUDING MRCP) TECHNIQUE: Multiplanar multisequence MR imaging of the abdomen was performed both before and after the administration of intravenous contrast. Heavily T2-weighted images of the biliary and pancreatic ducts were obtained, and three-dimensional MRCP images were rendered by post processing. CONTRAST:  10mL GADAVIST GADOBUTROL 1 MMOL/ML IV SOLN COMPARISON:  None Available. FINDINGS: Lower chest: No acute findings. Hepatobiliary: Hepatic parenchyma demonstrates normal signal intensity. No focal intrahepatic masses are identified. No intrahepatic biliary ductal dilation. The portal vein hepatic arteries, and hepatic veins are patent. The gallbladder is contracted. Mild edema of the gallbladder wall noted, however,  no superimposed pericholecystic inflammatory changes are identified. There are tiny gallstones identified within the gallbladder fundus as well as a single gallstone seen within the gallbladder neck. The extrahepatic bile duct is not dilated. Tiny filling defects are identified within the distal duct measuring up to 2 mm in size in keeping with tiny stones or debris and in keeping with choledocholithiasis. Pancreas: Normal parenchymal signal characteristics. No pancreatic mass identified. Scattered side branch ectasia noted within the uncinate process and body of the pancreas possibly the sequela remote inflammation. No superimposed peripancreatic inflammatory changes are identified. No peripancreatic fluid collections or lymphadenopathy. Spleen:  Within normal limits in size and appearance. Adrenals/Urinary Tract: The adrenal glands are unremarkable. The kidneys are normal in size and position. Mild bilateral nonspecific perinephric edema. Simple cortical cyst noted within the lower pole the right kidney for which no follow-up imaging is recommended. The kidneys are otherwise unremarkable. Stomach/Bowel: Visualized portions within the abdomen are unremarkable. Vascular/Lymphatic: No pathologically enlarged lymph nodes identified. No abdominal aortic aneurysm demonstrated. Other:  None. Musculoskeletal: No suspicious bone lesions identified. IMPRESSION: 1. Cholelithiasis. Mild edema of the gallbladder wall, without superimposed pericholecystic inflammatory change. 2. Tiny filling defects within the distal extrahepatic bile duct measuring up to 2 mm in size in keeping with tiny stones or debris and in keeping with choledocholithiasis. No associated biliary ductal dilation. 3. Scattered side branch ectasia within the uncinate process and body of the pancreas possibly the sequela remote inflammation. No superimposed peripancreatic inflammatory changes are identified. Electronically Signed   By: Helyn Numbers M.D.    On: 10/01/2023 03:47  CT ABDOMEN PELVIS W CONTRAST Result Date: 09/30/2023 CLINICAL DATA:  Abdominal pain, acute, nonlocalized EXAM: CT ABDOMEN AND PELVIS WITH CONTRAST TECHNIQUE: Multidetector CT imaging of the abdomen and pelvis was performed using the standard protocol following bolus administration of intravenous contrast. RADIATION DOSE REDUCTION: This exam was performed according to the departmental dose-optimization program which includes automated exposure control, adjustment of the mA and/or kV according to patient size and/or use of iterative reconstruction technique. CONTRAST:  OMNIPAQUE IOHEXOL 300 MG/ML  SOLN COMPARISON:  Ultrasound abdomen 09/30/2023 trauma CT abdomen pelvis 12/13/2012 FINDINGS: Lower chest: No acute abnormality.  Coronary artery calcification. Hepatobiliary: Diffusely hypodense hepatic parenchyma compared to the spleen consistent with hepatic steatosis. No focal lesion. Partially contracted gallbladder with mild gallbladder wall thickening. No CT evidence of radiopaque gallstones or pericholecystic fluid. No biliary dilatation. Pancreas: No focal lesion. Normal pancreatic contour. No surrounding inflammatory changes. No main pancreatic ductal dilatation. Spleen: Normal in size without focal abnormality. Adrenals/Urinary Tract: No adrenal nodule bilaterally. Bilateral kidneys enhance symmetrically. Fluid dense lesion likely represents a simple renal cyst. Simple renal cysts, in the absence of clinically indicated signs/symptoms, require no independent follow-up. No hydronephrosis. No hydroureter.  No nephroureterolithiasis. Mild circumferential urinary bladder wall thickening with query mild perivesicular fat stranding. On delayed imaging, there is no urothelial wall thickening and there are no filling defects in the opacified portions of the bilateral collecting systems or ureters. Stomach/Bowel: Stomach is within normal limits. No evidence of bowel wall thickening or  dilatation. Colonic diverticulosis. Appendix appears normal. Vascular/Lymphatic: No abdominal aorta or iliac aneurysm. Moderate atherosclerotic plaque of the aorta and its branches. No abdominal, pelvic, or inguinal lymphadenopathy. Reproductive: Prostate is unremarkable. Other: No intraperitoneal free fluid. No intraperitoneal free gas. No organized fluid collection. Musculoskeletal: No abdominal wall hernia or abnormality. No suspicious lytic or blastic osseous lesions. No acute displaced fracture. Multilevel degenerative changes of the spine. Diffusely decreased bone density. IMPRESSION: 1. Question mild cystitis.  Correlate with urinalysis. 2. Nonspecific mild gallbladder wall thickening possibly due to partial gallbladder contraction. Correlate with ultrasound abdomen 09/30/2023. 3. Hepatic steatosis. 4. Colonic diverticulosis with no acute diverticulitis. 5. Diffusely decreased bone density. Electronically Signed   By: Tish Frederickson M.D.   On: 09/30/2023 20:03   US Abdomen Limited RUQ (LIVER/GB) Result Date: 09/30/2023 CLINICAL DATA:  [id number] Liver disorder 100251. Elevated liver function tests. Jaundice. Fatigue EXAM: ULTRASOUND ABDOMEN LIMITED RIGHT UPPER QUADRANT COMPARISON:  CT abdomen pelvis 09/29/2022, CT stone 12/13/2012 FINDINGS: Gallbladder: Question partially contracted gallbladder. Cholelithiasis with gallbladder wall thickening. No pericholecystic visualized. No sonographic Murphy sign noted by sonographer. Common bile duct: Diameter: 6 mm Liver: No focal lesion identified. Increased parenchymal echogenicity. Portal vein is patent on color Doppler imaging with normal direction of blood flow towards the liver. Other: None. IMPRESSION: 1. Cholelithiasis with nonspecific gallbladder wall thickening. Gallbladder thickening may be due to partially contracted gallbladder. 2. Hepatic steatosis. Please note limited evaluation for focal hepatic masses in a patient with hepatic steatosis due to decreased  penetration of the acoustic ultrasound waves. Electronically Signed   By: Tish Frederickson M.D.   On: 09/30/2023 19:57    Scheduled Inpatient Medications:    amLODipine  5 mg Oral Daily   enoxaparin (LOVENOX) injection  0.5 mg/kg Subcutaneous Q24H   fluticasone  2 spray Each Nare Daily   loratadine  10 mg Oral Daily   losartan  25 mg Oral Daily   pantoprazole  40 mg Oral Daily    Continuous Inpatient Infusions:  sodium chloride 75 mL/hr at 10/02/23 0036    PRN Inpatient Medications:  acetaminophen **OR** acetaminophen, HYDROcodone-acetaminophen, morphine injection, ondansetron (ZOFRAN) IV  Miscellaneous: N/A  Assessment:  Choledocholithiasis - Largely asymptomatic at this point. Many subcm stones/debris noted. Elevated liver enzymes consistent with cholestasis - liver enzymes not rechecked since 2/14. Cholelithiasis without cholecystitis.  Plan:  Continue liquid diet. ERCP in am. Placed on Dr. Annabell Sabal schedule. Following along with you.  Viktoria Gruetzmacher K. Norma Fredrickson, M.D. 10/02/2023, 11:00 AM

## 2023-10-03 ENCOUNTER — Encounter
Admission: EM | Disposition: A | Discharge status: Home / Self Care | Payer: Self-pay | Source: Home / Self Care | Attending: Hospitalist

## 2023-10-03 ENCOUNTER — Encounter: Payer: Self-pay | Admitting: Gastroenterology

## 2023-10-03 ENCOUNTER — Inpatient Hospital Stay: Payer: PRIVATE HEALTH INSURANCE | Admitting: Anesthesiology

## 2023-10-03 ENCOUNTER — Inpatient Hospital Stay: Payer: PRIVATE HEALTH INSURANCE

## 2023-10-03 ENCOUNTER — Other Ambulatory Visit: Payer: Self-pay

## 2023-10-03 ENCOUNTER — Inpatient Hospital Stay: Payer: Self-pay | Admitting: Anesthesiology

## 2023-10-03 DIAGNOSIS — K8062 Calculus of gallbladder and bile duct with acute cholecystitis without obstruction: Secondary | ICD-10-CM

## 2023-10-03 DIAGNOSIS — K8050 Calculus of bile duct without cholangitis or cholecystitis without obstruction: Secondary | ICD-10-CM | POA: Diagnosis not present

## 2023-10-03 DIAGNOSIS — K5710 Diverticulosis of small intestine without perforation or abscess without bleeding: Secondary | ICD-10-CM

## 2023-10-03 HISTORY — PX: ERCP: SHX5425

## 2023-10-03 HISTORY — PX: SPHINCTEROTOMY: SHX5544

## 2023-10-03 HISTORY — PX: REMOVAL OF STONES: SHX5545

## 2023-10-03 LAB — COMPREHENSIVE METABOLIC PANEL
ALT: 98 U/L — ABNORMAL HIGH (ref 0–44)
AST: 35 U/L (ref 15–41)
Albumin: 3.3 g/dL — ABNORMAL LOW (ref 3.5–5.0)
Alkaline Phosphatase: 148 U/L — ABNORMAL HIGH (ref 38–126)
Anion gap: 7 (ref 5–15)
BUN: 15 mg/dL (ref 8–23)
CO2: 29 mmol/L (ref 22–32)
Calcium: 9 mg/dL (ref 8.9–10.3)
Chloride: 107 mmol/L (ref 98–111)
Creatinine, Ser: 0.98 mg/dL (ref 0.61–1.24)
GFR, Estimated: >60 mL/min (ref >60)
Glucose, Bld: 100 mg/dL — ABNORMAL HIGH (ref 70–99)
Potassium: 4.2 mmol/L (ref 3.5–5.1)
Sodium: 143 mmol/L (ref 135–145)
Total Bilirubin: 2.4 mg/dL — ABNORMAL HIGH (ref 0.0–1.2)
Total Protein: 6.9 g/dL (ref 6.5–8.1)

## 2023-10-03 LAB — RETICULOCYTES
ABS Retic: 60830 {cells}/uL (ref 25000–90000)
Retic Ct Pct: 1.1 %

## 2023-10-03 LAB — CBC
HCT: 45.4 % (ref 39.0–52.0)
Hemoglobin: 14.8 g/dL (ref 13.0–17.0)
MCH: 29.6 pg (ref 26.0–34.0)
MCHC: 32.6 g/dL (ref 30.0–36.0)
MCV: 90.8 fL (ref 80.0–100.0)
Platelets: 203 10*3/uL (ref 150–400)
RBC: 5 MIL/uL (ref 4.22–5.81)
RDW: 13.7 % (ref 11.5–15.5)
WBC: 5.5 10*3/uL (ref 4.0–10.5)
nRBC: 0 % (ref 0.0–0.2)

## 2023-10-03 LAB — ACUTE HEP PANEL AND HEP B SURFACE AB
HEPATITIS C ANTIBODY REFILL$(REFL): NONREACTIVE
Hep A IgM: NONREACTIVE
Hep B C IgM: NONREACTIVE
Hepatitis B Surface Ag: NONREACTIVE

## 2023-10-03 LAB — MAGNESIUM: Magnesium: 2.3 mg/dL (ref 1.7–2.4)

## 2023-10-03 LAB — REFLEX TIQ

## 2023-10-03 SURGERY — ERCP, WITH INTERVENTION IF INDICATED
Anesthesia: General

## 2023-10-03 MED ORDER — PROPOFOL 10 MG/ML IV BOLUS
INTRAVENOUS | Status: AC
  Filled 2023-10-03: qty 40

## 2023-10-03 MED ORDER — SODIUM CHLORIDE 0.9 % IV SOLN
2.0000 g | INTRAVENOUS | Status: AC
  Administered 2023-10-04: 2 g via INTRAVENOUS
  Filled 2023-10-03 (×2): qty 2

## 2023-10-03 MED ORDER — SODIUM CHLORIDE 0.9 % IV SOLN: INTRAVENOUS | Status: DC

## 2023-10-03 MED ORDER — PROPOFOL 500 MG/50ML IV EMUL
INTRAVENOUS | Status: DC | PRN
  Administered 2023-10-03: 80 mg via INTRAVENOUS
  Administered 2023-10-03: 120 ug/kg/min via INTRAVENOUS
  Administered 2023-10-03: 20 mg via INTRAVENOUS

## 2023-10-03 MED ORDER — SODIUM CHLORIDE 0.9 % IV SOLN
1.5000 g | Freq: Once | INTRAVENOUS | Status: DC
  Filled 2023-10-03: qty 4

## 2023-10-03 MED ORDER — DICLOFENAC SUPPOSITORY 100 MG
100.0000 mg | Freq: Once | RECTAL | Status: AC
Start: 2023-10-03 — End: 2023-10-03
  Administered 2023-10-03: 100 mg via RECTAL

## 2023-10-03 MED ORDER — LACTATED RINGERS IV SOLN
INTRAVENOUS | Status: DC
  Administered 2023-10-03: 40 mL/h via INTRAVENOUS

## 2023-10-03 MED ORDER — LIDOCAINE HCL (PF) 2 % IJ SOLN
INTRAMUSCULAR | Status: AC
  Filled 2023-10-03: qty 5

## 2023-10-03 MED ORDER — LIDOCAINE HCL (CARDIAC) PF 100 MG/5ML IV SOSY
PREFILLED_SYRINGE | INTRAVENOUS | Status: DC | PRN
Start: 2023-10-03 — End: 2023-10-03
  Administered 2023-10-03: 80 mg via INTRAVENOUS

## 2023-10-03 MED ORDER — DICLOFENAC SUPPOSITORY 100 MG
RECTAL | Status: AC
  Filled 2023-10-03: qty 1

## 2023-10-03 NOTE — Transfer of Care (Signed)
 Immediate Anesthesia Transfer of Care Note  Patient: Timothy Landry  Procedure(s) Performed: ENDOSCOPIC RETROGRADE CHOLANGIOPANCREATOGRAPHY (ERCP) REMOVAL OF STONES SPHINCTEROTOMY  Patient Location: PACU and Endoscopy Unit  Anesthesia Type:General  Level of Consciousness: awake, alert , oriented, and patient cooperative  Airway & Oxygen Therapy: Patient Spontanous Breathing  Post-op Assessment: Report given to RN and Post -op Vital signs reviewed and stable  Post vital signs: Reviewed and stable, DBP remains high, will monitor and treat prn  Last Vitals:  Vitals Value Taken Time  BP 133/95 10/03/23 1108  Temp 36.2 C 10/03/23 1108  Pulse 66 10/03/23 1111  Resp 1 10/03/23 1111  SpO2 94 % 10/03/23 1111  Vitals shown include unfiled device data.  Last Pain:  Vitals:   10/03/23 1108  TempSrc: Temporal  PainSc: 0-No pain         Complications: No notable events documented.

## 2023-10-03 NOTE — Op Note (Signed)
 [facility]enter[facility] Patient Name: Timothy Landry Procedure Date: 10/03/2023 10:39 AM MRN: [id number] Account #: [id number] Date of Birth: May 03, 1961 Admit Type: Inpatient Age: 63 Room: [facility] ENDO ROOM 4 Gender: Male Note Status: Finalized Instrument Name: [id number] Procedure:             ERCP Indications:           Bile duct stone(s) Providers:             Midge Minium MD, MD Referring MD:          Eustaquio Boyden (Referring MD) Medicines:             Propofol per Anesthesia Complications:         No immediate complications. Procedure:             Pre-Anesthesia Assessment:                        - Prior to the procedure, a History and Physical was                         performed, and patient medications and allergies were                         reviewed. The patient's tolerance of previous                         anesthesia was also reviewed. The risks and benefits                         of the procedure and the sedation options and risks                         were discussed with the patient. All questions were                         answered, and informed consent was obtained. Prior                         Anticoagulants: The patient has taken no anticoagulant                         or antiplatelet agents. ASA Grade Assessment: II - A                         patient with mild systemic disease. After reviewing                         the risks and benefits, the patient was deemed in                         satisfactory condition to undergo the procedure.                        After obtaining informed consent, the scope was passed                         under direct vision. Throughout the procedure, the  patient's blood pressure, pulse, and oxygen                         saturations were monitored continuously. The                         Duodenoscope was introduced through the mouth, and                         used  to inject contrast into and used to inject                         contrast into the bile duct. The ERCP was accomplished                         without difficulty. The patient tolerated the                         procedure well. Findings:      The scout film was normal. The major papilla was adjacent to a       diverticulum. The bile duct was deeply cannulated with the short-nosed       traction sphincterotome. Contrast was injected. I personally interpreted       the bile duct images. There was brisk flow of contrast through the       ducts. Image quality was excellent. Contrast extended to the entire       biliary tree. A wire was passed into the biliary tree. A 7 mm biliary       sphincterotomy was made with a traction (standard) sphincterotome using       ERBE electrocautery. There was no post-sphincterotomy bleeding. The       biliary tree was swept with a 15 mm balloon starting at the bifurcation.       Two stones were removed. No stones remained. Impression:            - The major papilla was adjacent to a diverticulum.                        - Choledocholithiasis was found. Complete removal was                         accomplished by biliary sphincterotomy and balloon                         extraction.                        - A biliary sphincterotomy was performed.                        - The biliary tree was swept. Recommendation:        - Return patient to hospital ward for ongoing care.                        - Clear liquid diet today.                        - Refer to a Careers adviser.                        -  Watch for pancreatitis, bleeding, perforation, and                         cholangitis. Procedure Code(s):     --- Professional ---                        [phone], Endoscopic retrograde cholangiopancreatography                         (ERCP); with removal of calculi/debris from                         biliary/pancreatic duct(s)                        43262, Endoscopic  retrograde cholangiopancreatography                         (ERCP); with sphincterotomy/papillotomy                        [phone], Endoscopic catheterization of the biliary                         ductal system, radiological supervision and                         interpretation Diagnosis Code(s):     --- Professional ---                        K80.50, Calculus of bile duct without cholangitis or                         cholecystitis without obstruction CPT copyright 2022 American Medical Association. All rights reserved. The codes documented in this report are preliminary and upon coder review may  be revised to meet current compliance requirements. Midge Minium MD, MD 10/03/2023 11:03:51 AM This report has been signed electronically. Number of Addenda: 0 Note Initiated On: 10/03/2023 10:39 AM Estimated Blood Loss:  Estimated blood loss: none.      [facility]

## 2023-10-03 NOTE — Progress Notes (Signed)
  PROGRESS NOTE    Timothy Landry  ZOX:[id number] DOB: 11/23/1960 DOA: 09/30/2023 PCP: Eustaquio Boyden, MD  [id number]/222A-BB  LOS: 2 days   Brief hospital course:   Assessment & Plan: Timothy Landry is a 63 y.o. male with medical history significant of  HTN, HLD, BPH, gout. He presented to the [facility] ED yesterday evening after his PCP notified him of elevated LFTs on outpatient labs.     # Cholestasis 2/2 #Choledocholithiasis MRCP with filing defects of distal extrahepatic bile duct indicative of small stones or debris. AST/ALT 98/216, Tbili 10.7. Imaging without evidence of cholangitis, or intra-abdominal infection. Afebrile.  No Leukocytosis.  Abx was not started. --ERCP today with 2 stones removed Plan: --GenSurg consult today, with plan for cholecystectomy tomorrow   #Hypertension Uncontrolled, BP as high as 152/115 in ED --cont home amlodipine and losartan   #GERD --cont PPI   DVT prophylaxis: Lovenox SQ Code Status: Full code  Family Communication: family updated at bedside today Level of care: Med-Surg Dispo:   The patient is from: home Anticipated d/c is to: home Anticipated d/c date is: 1-2 days   Subjective and Interval History:  Pt underwent ERCP today with 2 stones removed.  Tolerated it well.  GenSurg consulted today, will plan on cholecystectomy.     Objective: Vitals:   10/03/23 1108 10/03/23 1118 10/03/23 1125 10/03/23 1141  BP: (!) 133/95 (!) 134/94 (!) 139/94 (!) 146/93  Pulse: 66   61  Resp: 15  18 18   Temp: (!) 97.1 F (36.2 C)   97.6 F (36.4 C)  TempSrc: Temporal     SpO2: 97%   94%  Weight:      Height:        Intake/Output Summary (Last 24 hours) at 10/03/2023 1657 Last data filed at 10/03/2023 1103 Gross per 24 hour  Intake 200 ml  Output --  Net 200 ml   Filed Weights   10/01/23 1308  Weight: 108 kg    Examination:   Constitutional: NAD, AAOx3 HEENT: conjunctivae and lids normal, EOMI CV: No cyanosis.   RESP: normal  respiratory effort, on RA Neuro: II - XII grossly intact.   Psych: Normal mood and affect.  Appropriate judgement and reason   Data Reviewed: I have personally reviewed labs and imaging studies  Time spent: 35 minutes  Darlin Priestly, MD [facility] If 7PM-7AM, please contact night-coverage 10/03/2023, 4:57 PM

## 2023-10-03 NOTE — Plan of Care (Signed)

## 2023-10-03 NOTE — Anesthesia Preprocedure Evaluation (Signed)
 Anesthesia Evaluation  Patient identified by MRN, date of birth, ID band Patient awake    Reviewed: Allergy & Precautions, NPO status , Patient's Chart, lab work & pertinent test results  History of Anesthesia Complications Negative for: history of anesthetic complications  Airway Mallampati: III  TM Distance: >3 FB Neck ROM: full    Dental no notable dental hx.    Pulmonary neg pulmonary ROS   Pulmonary exam normal        Cardiovascular hypertension, On Medications Normal cardiovascular exam     Neuro/Psych negative neurological ROS  negative psych ROS   GI/Hepatic Neg liver ROS, hiatal hernia,GERD  Medicated,,  Endo/Other  negative endocrine ROS    Renal/GU negative Renal ROS  negative genitourinary   Musculoskeletal   Abdominal   Peds  Hematology negative hematology ROS (+)   Anesthesia Other Findings Past Medical History: 2016: BPH (benign prostatic hypertrophy)     Comment:  s/p photovaporization Evelene Croon) 04/2012: Diverticulosis No date: GERD (gastroesophageal reflux disease) 08/1996: Gout 08/1996: History of hiatal hernia     Comment:  Barium Swallow HH, Mod . Size 12/1998: Hyperlipidemia 2014: Nephrolithiasis     Comment:  stent placed, then removed No date: Self-catheterizes urinary bladder     Comment:  2-3 times/day  Past Surgical History: 04/2012: COLONOSCOPY     Comment:  mod diverticulosis, 3 polyps - benign, rec rpt 10 yrs               (Pyrtle) 1994: CYSTECTOMY     Comment:  cystectomy right foot and gluteal area 06/03/2015: CYSTOSCOPY WITH LITHOLAPAXY     Comment:  Procedure: CYSTOSCOPY WITH LITHOLAPAXY WITH HOLMIUM               LASER ;  Surgeon: Orson Ape, MD;  Location: [facility]               ORS;  Service: Urology;; 06/03/2015: GREEN LIGHT LASER TURP (TRANSURETHRAL RESECTION OF  PROSTATE; N/A     Comment:  Procedure: GREEN LIGHT LASER TURP (TRANSURETHRAL               RESECTION  OF PROSTATE;  Surgeon: Orson Ape, MD;                Location: [facility] ORS;  Service: Urology;  Laterality: N/A; 1999: LITHOTRIPSY 2014: LITHOTRIPSY     Comment:  with stent placement, s/p removal Evelene Croon) 05/2015: stress echocardiogram     Comment:  overall normal after max exercise, severe hypertensive               response, EF 60-65% No date: WRIST GANGLION EXCISION  BMI    Body Mass Index: 37.29 kg/m      Reproductive/Obstetrics negative OB ROS                              Anesthesia Physical Anesthesia Plan  ASA: 2  Anesthesia Plan: General   Post-op Pain Management: Minimal or no pain anticipated   Induction: Intravenous  PONV Risk Score and Plan: 1 and Propofol infusion and TIVA  Airway Management Planned: Natural Airway and Nasal Cannula  Additional Equipment:   Intra-op Plan:   Post-operative Plan:   Informed Consent: I have reviewed the patients History and Physical, chart, labs and discussed the procedure including the risks, benefits and alternatives for the proposed anesthesia with the patient or authorized representative who has indicated his/her understanding and acceptance.  Dental Advisory Given  Plan Discussed with: Anesthesiologist, CRNA and Surgeon  Anesthesia Plan Comments: (Patient consented for risks of anesthesia including but not limited to:  - adverse reactions to medications - risk of airway placement if required - damage to eyes, teeth, lips or other oral mucosa - nerve damage due to positioning  - sore throat or hoarseness - Damage to heart, brain, nerves, lungs, other parts of body or loss of life  Patient voiced understanding and assent.)         Anesthesia Quick Evaluation

## 2023-10-03 NOTE — Consult Note (Signed)
 Patient ID: Timothy Landry, male   DOB: 09/18/1960, 63 y.o.   MRN: [id number] CC: Choledocholithiasis  History of Present Illness Timothy Landry is a 63 y.o. male with past medical history consistent with hypertension who presents in consultation for choledocholithiasis.  The patient reports that last Wednesday he had what he thought was a back spasm that was associated with nausea.  He reports that over the last several weeks he has had several episodes like this but they went away after a few minutes.  He reports that this time it did not go away and then he noticed that he was having orange tinted urine.  He went to his primary care doctor who obtained labs that showed a transaminitis as well as a hyperbilirubinemia so he was instructed to come to the emergency department for evaluation.  He reports that since then he has not had any additional pain.  He says that his urine has cleared up.  He denies any further nausea.  He underwent ERCP today that showed choledocholithiasis..  Past Medical History Past Medical History:  Diagnosis Date   BPH (benign prostatic hypertrophy) 2016   s/p photovaporization Evelene Croon)   Diverticulosis 04/2012   GERD (gastroesophageal reflux disease)    Gout 08/1996   History of hiatal hernia 08/1996   Barium Swallow HH, Mod . Size   Hyperlipidemia 12/1998   Nephrolithiasis 2014   stent placed, then removed   Self-catheterizes urinary bladder    2-3 times/day       Past Surgical History:  Procedure Laterality Date   COLONOSCOPY  04/2012   mod diverticulosis, 3 polyps - benign, rec rpt 10 yrs (Pyrtle)   CYSTECTOMY  1994   cystectomy right foot and gluteal area   CYSTOSCOPY WITH LITHOLAPAXY  06/03/2015   Procedure: CYSTOSCOPY WITH LITHOLAPAXY WITH HOLMIUM LASER ;  Surgeon: Orson Ape, MD;  Location: [facility] ORS;  Service: Urology;;   GREEN LIGHT LASER TURP (TRANSURETHRAL RESECTION OF PROSTATE N/A 06/03/2015   Procedure: GREEN LIGHT LASER TURP (TRANSURETHRAL  RESECTION OF PROSTATE;  Surgeon: Orson Ape, MD;  Location: [facility] ORS;  Service: Urology;  Laterality: N/A;   LITHOTRIPSY  1999   LITHOTRIPSY  2014   with stent placement, s/p removal Evelene Croon)   stress echocardiogram  05/2015   overall normal after max exercise, severe hypertensive response, EF 60-65%   WRIST GANGLION EXCISION      No Known Allergies  Current Facility-Administered Medications  Medication Dose Route Frequency Provider Last Rate Last Admin   acetaminophen (TYLENOL) tablet 650 mg  650 mg Oral Q6H PRN Midge Minium, MD       Or   acetaminophen (TYLENOL) suppository 650 mg  650 mg Rectal Q6H PRN Midge Minium, MD       amLODipine (NORVASC) tablet 5 mg  5 mg Oral Daily Midge Minium, MD   5 mg at 10/03/23 0841   enoxaparin (LOVENOX) injection 55 mg  0.5 mg/kg Subcutaneous Q24H Midge Minium, MD   55 mg at 10/03/23 0841   fluticasone (FLONASE) 50 MCG/ACT nasal spray 2 spray  2 spray Each Nare Daily Midge Minium, MD   2 spray at 10/03/23 0840   HYDROcodone-acetaminophen (NORCO/VICODIN) 5-325 MG per tablet 1-2 tablet  1-2 tablet Oral Q4H PRN Midge Minium, MD   1 tablet at 10/02/23 1732   loratadine (CLARITIN) tablet 10 mg  10 mg Oral Daily Midge Minium, MD   10 mg at 10/03/23 0841   losartan (COZAAR) tablet 25 mg  25 mg Oral Daily Midge Minium, MD   25 mg at 10/03/23 0841   morphine (PF) 4 MG/ML injection 4 mg  4 mg Intravenous Q4H PRN Midge Minium, MD       ondansetron [facility]) injection 4 mg  4 mg Intravenous Q8H PRN Midge Minium, MD       pantoprazole (PROTONIX) EC tablet 40 mg  40 mg Oral Daily Midge Minium, MD   40 mg at 10/03/23 1610    Family History Family History  Problem Relation Age of Onset   Hypertension Mother    Diabetes Father    Hypertension Father    Kidney disease Father    Hypertension Sister    CAD Paternal Grandfather 35       MI   Cancer Neg Hx    Stroke Neg Hx        Social History Social History   Tobacco Use   Smoking status: Never    Smokeless tobacco: Current    Types: Chew   Tobacco comments:    1 can/day  Substance Use Topics   Alcohol use: Yes    Comment: rare beer   Drug use: No        ROS Full ROS of systems performed and is otherwise negative there than what is stated in the HPI  Physical Exam Blood pressure (!) 146/93, pulse 61, temperature 97.6 F (36.4 C), resp. rate 18, height 5\' 7"  (1.702 m), weight 108 kg, SpO2 94%.  Alert and oriented x 3, good auscultation bilaterally, regular rate and rhythm, abdomen is soft, obese, nontender to palpation and nondistended.  Negative Murphy sign moving all extremities spontaneously.  No scleral icterus. Data Reviewed I reviewed his labs and his labs were consistent with choledocholithiasis that has now downtrended.  I also reviewed his CT scan that showed a thickened gallbladder wall but there is not much inflammation around the gallbladder.  He had an ultrasound that also showed a thickened gallbladder wall without pericholecystic fluid.  I reviewed his ERCP images and he underwent a sphincterotomy with 2 stones were noted to be within the distal common bile duct.  I have personally reviewed the patient's imaging and medical records.    Assessment    The patient is a 63 year old male with past medical history of hypertension who has choledocholithiasis status post ERCP surgery was consulted for robotic assisted cholecystectomy.  Plan    I discussed with him that I recommend robotic assisted cholecystectomy given that he has choledocholithiasis.  Discussed risk, benefits alternatives of the procedure including risk of infection, bleeding, bile leak, retained stone, injury to the common bile duct and need for open procedure.  He understands these risks and wishes to proceed.  Unfortunately had something to drink for lunch today so we will plan for surgery tomorrow.  N.p.o. after midnight and will have cefoxitin on-call to OR.    Kandis Cocking 10/03/2023, 1:33  PM

## 2023-10-03 NOTE — Anesthesia Postprocedure Evaluation (Signed)
 Anesthesia Post Note  Patient: Timothy Landry  Procedure(s) Performed: ENDOSCOPIC RETROGRADE CHOLANGIOPANCREATOGRAPHY (ERCP) REMOVAL OF STONES SPHINCTEROTOMY  Patient location during evaluation: Endoscopy Anesthesia Type: General Level of consciousness: awake and alert Pain management: pain level controlled Vital Signs Assessment: post-procedure vital signs reviewed and stable Respiratory status: spontaneous breathing, nonlabored ventilation, respiratory function stable and patient connected to nasal cannula oxygen Cardiovascular status: blood pressure returned to baseline and stable Postop Assessment: no apparent nausea or vomiting Anesthetic complications: no   No notable events documented.   Last Vitals:  Vitals:   10/03/23 1118 10/03/23 1125  BP: (!) 134/94 (!) 139/94  Pulse:    Resp:  18  Temp:    SpO2:      Last Pain:  Vitals:   10/03/23 1125  TempSrc:   PainSc: 0-No pain                 Louie Boston

## 2023-10-04 ENCOUNTER — Encounter
Admission: EM | Disposition: A | Discharge status: Home / Self Care | Payer: Self-pay | Source: Home / Self Care | Attending: Hospitalist

## 2023-10-04 ENCOUNTER — Inpatient Hospital Stay: Payer: PRIVATE HEALTH INSURANCE | Admitting: Anesthesiology

## 2023-10-04 ENCOUNTER — Other Ambulatory Visit: Payer: Self-pay

## 2023-10-04 ENCOUNTER — Encounter: Payer: Self-pay | Admitting: Internal Medicine

## 2023-10-04 DIAGNOSIS — K8050 Calculus of bile duct without cholangitis or cholecystitis without obstruction: Secondary | ICD-10-CM | POA: Diagnosis not present

## 2023-10-04 DIAGNOSIS — R7989 Other specified abnormal findings of blood chemistry: Secondary | ICD-10-CM | POA: Diagnosis not present

## 2023-10-04 LAB — COMPREHENSIVE METABOLIC PANEL
ALT: 143 U/L — ABNORMAL HIGH (ref 0–44)
AST: 92 U/L — ABNORMAL HIGH (ref 15–41)
Albumin: 3.2 g/dL — ABNORMAL LOW (ref 3.5–5.0)
Alkaline Phosphatase: 203 U/L — ABNORMAL HIGH (ref 38–126)
Anion gap: 9 (ref 5–15)
BUN: 14 mg/dL (ref 8–23)
CO2: 26 mmol/L (ref 22–32)
Calcium: 8.6 mg/dL — ABNORMAL LOW (ref 8.9–10.3)
Chloride: 105 mmol/L (ref 98–111)
Creatinine, Ser: 0.78 mg/dL (ref 0.61–1.24)
GFR, Estimated: >60 mL/min (ref >60)
Glucose, Bld: 95 mg/dL (ref 70–99)
Potassium: 3.7 mmol/L (ref 3.5–5.1)
Sodium: 140 mmol/L (ref 135–145)
Total Bilirubin: 3.5 mg/dL — ABNORMAL HIGH (ref 0.0–1.2)
Total Protein: 6.5 g/dL (ref 6.5–8.1)

## 2023-10-04 LAB — CBC
HCT: 41.7 % (ref 39.0–52.0)
Hemoglobin: 14.2 g/dL (ref 13.0–17.0)
MCH: 30.6 pg (ref 26.0–34.0)
MCHC: 34.1 g/dL (ref 30.0–36.0)
MCV: 89.9 fL (ref 80.0–100.0)
Platelets: 192 10*3/uL (ref 150–400)
RBC: 4.64 MIL/uL (ref 4.22–5.81)
RDW: 13.4 % (ref 11.5–15.5)
WBC: 7.1 10*3/uL (ref 4.0–10.5)
nRBC: 0 % (ref 0.0–0.2)

## 2023-10-04 SURGERY — CHOLECYSTECTOMY, ROBOT-ASSISTED, LAPAROSCOPIC
Anesthesia: General

## 2023-10-04 MED ORDER — DEXMEDETOMIDINE HCL IN NACL 80 MCG/20ML IV SOLN
INTRAVENOUS | Status: AC
  Filled 2023-10-04: qty 20

## 2023-10-04 MED ORDER — ROCURONIUM BROMIDE 100 MG/10ML IV SOLN
INTRAVENOUS | Status: DC | PRN
  Administered 2023-10-04: 20 mg via INTRAVENOUS
  Administered 2023-10-04: 70 mg via INTRAVENOUS

## 2023-10-04 MED ORDER — LACTATED RINGERS IV SOLN: INTRAVENOUS | Status: DC | PRN

## 2023-10-04 MED ORDER — MIDAZOLAM HCL 2 MG/2ML IJ SOLN
INTRAMUSCULAR | Status: AC
  Filled 2023-10-04: qty 2

## 2023-10-04 MED ORDER — FENTANYL CITRATE (PF) 100 MCG/2ML IJ SOLN
INTRAMUSCULAR | Status: DC | PRN
  Administered 2023-10-04 (×2): 50 ug via INTRAVENOUS

## 2023-10-04 MED ORDER — DEXMEDETOMIDINE HCL IN NACL 80 MCG/20ML IV SOLN
INTRAVENOUS | Status: DC | PRN
  Administered 2023-10-04: 8 ug via INTRAVENOUS

## 2023-10-04 MED ORDER — BUPIVACAINE-EPINEPHRINE (PF) 0.5% -1:200000 IJ SOLN
INTRAMUSCULAR | Status: AC
  Filled 2023-10-04: qty 10

## 2023-10-04 MED ORDER — DROPERIDOL 2.5 MG/ML IJ SOLN
0.6250 mg | Freq: Once | INTRAMUSCULAR | Status: AC
Start: 2023-10-04 — End: 2023-10-04
  Administered 2023-10-04: 0.625 mg via INTRAVENOUS

## 2023-10-04 MED ORDER — PHENYLEPHRINE HCL-NACL 20-0.9 MG/250ML-% IV SOLN
INTRAVENOUS | Status: DC | PRN
  Administered 2023-10-04: 25 ug/min via INTRAVENOUS

## 2023-10-04 MED ORDER — ACETAMINOPHEN 10 MG/ML IV SOLN
INTRAVENOUS | Status: AC
  Filled 2023-10-04: qty 100

## 2023-10-04 MED ORDER — LIDOCAINE HCL (CARDIAC) PF 100 MG/5ML IV SOSY
PREFILLED_SYRINGE | INTRAVENOUS | Status: DC | PRN
  Administered 2023-10-04: 100 mg via INTRAVENOUS

## 2023-10-04 MED ORDER — BUPIVACAINE-EPINEPHRINE (PF) 0.5% -1:200000 IJ SOLN
INTRAMUSCULAR | Status: DC | PRN
  Administered 2023-10-04: 10 mL

## 2023-10-04 MED ORDER — DROPERIDOL 2.5 MG/ML IJ SOLN
INTRAMUSCULAR | Status: AC
  Filled 2023-10-04: qty 2

## 2023-10-04 MED ORDER — PHENYLEPHRINE HCL-NACL 20-0.9 MG/250ML-% IV SOLN
INTRAVENOUS | Status: AC
  Filled 2023-10-04: qty 250

## 2023-10-04 MED ORDER — LIDOCAINE HCL (PF) 2 % IJ SOLN
INTRAMUSCULAR | Status: AC
  Filled 2023-10-04: qty 5

## 2023-10-04 MED ORDER — BUPIVACAINE LIPOSOME 1.3 % IJ SUSP
INTRAMUSCULAR | Status: DC | PRN
  Administered 2023-10-04: 10 mL

## 2023-10-04 MED ORDER — KETOROLAC TROMETHAMINE 30 MG/ML IJ SOLN
INTRAMUSCULAR | Status: AC
  Filled 2023-10-04: qty 1

## 2023-10-04 MED ORDER — KETOROLAC TROMETHAMINE 30 MG/ML IJ SOLN
INTRAMUSCULAR | Status: DC | PRN
  Administered 2023-10-04: 30 mg via INTRAVENOUS

## 2023-10-04 MED ORDER — ONDANSETRON HCL 4 MG/2ML IJ SOLN
INTRAMUSCULAR | Status: DC | PRN
  Administered 2023-10-04: 4 mg via INTRAVENOUS

## 2023-10-04 MED ORDER — GLYCOPYRROLATE 0.2 MG/ML IJ SOLN
INTRAMUSCULAR | Status: DC | PRN
  Administered 2023-10-04: .2 mg via INTRAVENOUS

## 2023-10-04 MED ORDER — OXYCODONE HCL 5 MG/5ML PO SOLN: 5.0000 mg | Freq: Once | ORAL | Status: DC | PRN

## 2023-10-04 MED ORDER — ONDANSETRON HCL 4 MG/2ML IJ SOLN
INTRAMUSCULAR | Status: AC
  Filled 2023-10-04: qty 2

## 2023-10-04 MED ORDER — PROPOFOL 10 MG/ML IV BOLUS
INTRAVENOUS | Status: AC
  Filled 2023-10-04: qty 40

## 2023-10-04 MED ORDER — OXYCODONE HCL 5 MG PO TABS: 5.0000 mg | ORAL_TABLET | Freq: Once | ORAL | Status: DC | PRN

## 2023-10-04 MED ORDER — INDOCYANINE GREEN 25 MG IV SOLR
1.2500 mg | Freq: Once | INTRAVENOUS | Status: AC
  Administered 2023-10-04: 1.25 mg via TOPICAL

## 2023-10-04 MED ORDER — INDOCYANINE GREEN 25 MG IV SOLR
INTRAVENOUS | Status: AC
  Filled 2023-10-04: qty 10

## 2023-10-04 MED ORDER — PROPOFOL 10 MG/ML IV BOLUS
INTRAVENOUS | Status: DC | PRN
  Administered 2023-10-04: 200 mg via INTRAVENOUS

## 2023-10-04 MED ORDER — MIDAZOLAM HCL 2 MG/2ML IJ SOLN
INTRAMUSCULAR | Status: DC | PRN
  Administered 2023-10-04: 2 mg via INTRAVENOUS

## 2023-10-04 MED ORDER — FENTANYL CITRATE (PF) 100 MCG/2ML IJ SOLN: 25.0000 ug | INTRAMUSCULAR | Status: DC | PRN

## 2023-10-04 MED ORDER — 0.9 % SODIUM CHLORIDE (POUR BTL) OPTIME
TOPICAL | Status: DC | PRN
  Administered 2023-10-04: 500 mL

## 2023-10-04 MED ORDER — ROCURONIUM BROMIDE 10 MG/ML (PF) SYRINGE
PREFILLED_SYRINGE | INTRAVENOUS | Status: AC
  Filled 2023-10-04: qty 10

## 2023-10-04 MED ORDER — PHENYLEPHRINE 80 MCG/ML (10ML) SYRINGE FOR IV PUSH (FOR BLOOD PRESSURE SUPPORT)
PREFILLED_SYRINGE | INTRAVENOUS | Status: DC | PRN
  Administered 2023-10-04 (×3): 160 ug via INTRAVENOUS

## 2023-10-04 MED ORDER — FENTANYL CITRATE (PF) 100 MCG/2ML IJ SOLN
INTRAMUSCULAR | Status: AC
  Filled 2023-10-04: qty 2

## 2023-10-04 MED ORDER — DEXAMETHASONE SODIUM PHOSPHATE 10 MG/ML IJ SOLN
INTRAMUSCULAR | Status: DC | PRN
  Administered 2023-10-04: 10 mg via INTRAVENOUS

## 2023-10-04 MED ORDER — SUGAMMADEX SODIUM 200 MG/2ML IV SOLN
INTRAVENOUS | Status: DC | PRN
  Administered 2023-10-04: 200 mg via INTRAVENOUS

## 2023-10-04 MED ORDER — DEXAMETHASONE SODIUM PHOSPHATE 10 MG/ML IJ SOLN
INTRAMUSCULAR | Status: AC
  Filled 2023-10-04: qty 1

## 2023-10-04 MED ORDER — ACETAMINOPHEN 10 MG/ML IV SOLN
INTRAVENOUS | Status: DC | PRN
  Administered 2023-10-04: 1000 mg via INTRAVENOUS

## 2023-10-04 MED ORDER — BUPIVACAINE LIPOSOME 1.3 % IJ SUSP
INTRAMUSCULAR | Status: AC
  Filled 2023-10-04: qty 10

## 2023-10-04 SURGICAL SUPPLY — 41 items
BAG PRESSURE INF REUSE 1000 (BAG) IMPLANT
CANNULA REDUCER 12-8 DVNC XI (CANNULA) ×1 IMPLANT
CAUTERY HOOK MNPLR 1.6 DVNC XI (INSTRUMENTS) ×1 IMPLANT
CLIP LIGATING HEMO O LOK GREEN (MISCELLANEOUS) ×1 IMPLANT
DERMABOND ADVANCED .7 DNX12 (GAUZE/BANDAGES/DRESSINGS) ×1 IMPLANT
DRAPE ARM DVNC X/XI (DISPOSABLE) ×4 IMPLANT
DRAPE COLUMN DVNC XI (DISPOSABLE) ×1 IMPLANT
ELECT REM PT RETURN 9FT ADLT (ELECTROSURGICAL) ×1 IMPLANT
ELECTRODE REM PT RTRN 9FT ADLT (ELECTROSURGICAL) ×1 IMPLANT
FORCEPS BPLR 8 MD DVNC XI (FORCEP) IMPLANT
FORCEPS BPLR R/ABLATION 8 DVNC (INSTRUMENTS) ×1 IMPLANT
FORCEPS PROGRASP DVNC XI (FORCEP) ×1 IMPLANT
GLOVE BIOGEL PI IND STRL 7.5 (GLOVE) ×2 IMPLANT
GLOVE SURG SYN 7.0 (GLOVE) ×2 IMPLANT
GLOVE SURG SYN 7.0 PF PI (GLOVE) ×2 IMPLANT
GOWN STRL REUS W/ TWL LRG LVL3 (GOWN DISPOSABLE) ×3 IMPLANT
GRASPER SUT TROCAR 14GX15 (MISCELLANEOUS) ×1 IMPLANT
IRRIGATOR SUCT 8 DISP DVNC XI (IRRIGATION / IRRIGATOR) IMPLANT
IV NS 1000ML BAXH (IV SOLUTION) IMPLANT
KIT PINK PAD W/HEAD ARE REST (MISCELLANEOUS) ×1 IMPLANT
KIT PINK PAD W/HEAD ARM REST (MISCELLANEOUS) ×1 IMPLANT
LABEL OR SOLS (LABEL) ×1 IMPLANT
MANIFOLD NEPTUNE II (INSTRUMENTS) ×1 IMPLANT
NDL HYPO 22X1.5 SAFETY MO (MISCELLANEOUS) ×1 IMPLANT
NDL INSUFFLATION 14GA 120MM (NEEDLE) ×1 IMPLANT
NEEDLE HYPO 22X1.5 SAFETY MO (MISCELLANEOUS) ×1 IMPLANT
NEEDLE INSUFFLATION 14GA 120MM (NEEDLE) ×1 IMPLANT
NS IRRIG 500ML POUR BTL (IV SOLUTION) ×1 IMPLANT
OBTURATOR OPTICAL STND 8 DVNC (TROCAR) ×1 IMPLANT
OBTURATOR OPTICALSTD 8 DVNC (TROCAR) ×1 IMPLANT
PACK LAP CHOLECYSTECTOMY (MISCELLANEOUS) ×1 IMPLANT
SEAL UNIV 5-12 XI (MISCELLANEOUS) ×4 IMPLANT
SET TUBE SMOKE EVAC HIGH FLOW (TUBING) ×1 IMPLANT
SOL ELECTROSURG ANTI STICK (MISCELLANEOUS) ×1 IMPLANT
SOLUTION ELECTROSURG ANTI STCK (MISCELLANEOUS) ×1 IMPLANT
SPONGE T-LAP 4X18 ~~LOC~~+RFID (SPONGE) IMPLANT
SUT MNCRL 4-0 27 PS-2 XMFL (SUTURE) ×1 IMPLANT
SUT VICRYL 0 UR6 27IN ABS (SUTURE) ×1 IMPLANT
SUTURE MNCRL 4-0 27XMF (SUTURE) ×1 IMPLANT
SYS BAG RETRIEVAL 10MM (BASKET) ×1 IMPLANT
SYSTEM BAG RETRIEVAL 10MM (BASKET) ×1 IMPLANT

## 2023-10-04 NOTE — Anesthesia Postprocedure Evaluation (Signed)
 Anesthesia Post Note  Patient: Timothy Landry  Procedure(s) Performed: XI ROBOTIC ASSISTED LAPAROSCOPIC CHOLECYSTECTOMY INDOCYANINE GREEN FLUORESCENCE IMAGING (ICG)  Patient location during evaluation: PACU Anesthesia Type: General Level of consciousness: awake and alert Pain management: pain level controlled Vital Signs Assessment: post-procedure vital signs reviewed and stable Respiratory status: spontaneous breathing, nonlabored ventilation, respiratory function stable and patient connected to nasal cannula oxygen Cardiovascular status: blood pressure returned to baseline and stable Postop Assessment: no apparent nausea or vomiting Anesthetic complications: no  No notable events documented.   Last Vitals:  Vitals:   10/04/23 1936 10/04/23 1945  BP:  121/75  Pulse: 88 81  Resp: 19 20  Temp:    SpO2:  95%    Last Pain:  Vitals:   10/04/23 1945  TempSrc:   PainSc: 2                  Stephanie Coup

## 2023-10-04 NOTE — TOC CM/SW Note (Signed)
 Transition of Care [facility] - Inpatient Brief Assessment   Patient Details  Name: Timothy Landry MRN: [id number] Date of Birth: November 06, 1960  Transition of [facility] CM/SW Contact:    Margarito Liner, LCSW Phone Number: 10/04/2023, 1:28 PM   Clinical Narrative: CSW reviewed chart. No TOC needs identified. CSW will continue to follow progress. Please place [facility] consult if any needs arise. Pharmacy is aware he does not have insurance.  Transition of Care Asessment: Insurance and Status: Selfpay Patient has primary care physician: Yes Home environment has been reviewed: Single family home Prior level of function:: Not documented Prior/Current Home Services: No current home services Social Drivers of Health Review: SDOH reviewed no interventions necessary Readmission risk has been reviewed: Yes Transition of care needs: no transition of care needs at this time

## 2023-10-04 NOTE — Progress Notes (Signed)
    Midge Minium, MD [facility]   [address], [address] [place], [place] [address] Phone: [phone] Fax : [phone]   Subjective: The patient did well overnight without any complications.  The patient had a successful ERCP with 2 stones removed from the common bile duct.  The patient's liver enzymes did bump a little today.   Objective: Vital signs in last 24 hours: Vitals:   10/03/23 1756 10/03/23 1951 10/04/23 0411 10/04/23 0842  BP: 124/82 136/84 (!) 138/94 (!) 140/93  Pulse: 67 62 64 65  Resp: 18 20 20    Temp: 97.7 F (36.5 C) 98.1 F (36.7 C) 98.3 F (36.8 C) 98.2 F (36.8 C)  TempSrc: Oral Oral Oral Oral  SpO2: 93% 93% 98% 91%  Weight:      Height:       Weight change:   Intake/Output Summary (Last 24 hours) at 10/04/2023 1032 Last data filed at 10/03/2023 1900 Gross per 24 hour  Intake 560 ml  Output --  Net 560 ml     Exam: Heart:: Regular rate and rhythm or without murmur or extra heart sounds Lungs: normal and clear to auscultation and percussion Abdomen: soft, nontender, normal bowel sounds   Lab Results: @LABTEST2 @ Micro Results: No results found for this or any previous visit (from the past 240 hours). Studies/Results: DG C-Arm 1-60 Min-No Report Result Date: 10/03/2023 Fluoroscopy was utilized by the requesting physician.  No radiographic interpretation.   Medications: I have reviewed the patient's current medications. Scheduled Meds:  amLODipine  5 mg Oral Daily   enoxaparin (LOVENOX) injection  0.5 mg/kg Subcutaneous Q24H   fluticasone  2 spray Each Nare Daily   indocyanine green  1.25 mg Topical Once   loratadine  10 mg Oral Daily   losartan  25 mg Oral Daily   pantoprazole  40 mg Oral Daily   Continuous Infusions:  cefOXitin     PRN Meds:.acetaminophen **OR** acetaminophen, HYDROcodone-acetaminophen, morphine injection, ondansetron (ZOFRAN) IV   Assessment: Principal Problem:   Choledocholithiasis Active Problems:   HLD  (hyperlipidemia)   Gout   HTN (hypertension)    Plan: The patient is going for a cholecystectomy today.  The patient's ERCP was successful and the patient did have a slight rise in his liver enzymes likely due to inflammation.  The patient has had no signs of post ERCP pancreatitis and denies any pain this morning.  Nothing further to do from a GI point of view.  I will sign off.  Please call if any further GI concerns or questions.  We would like to thank you for the opportunity to participate in the care of Timothy Landry.    LOS: 3 days   Midge Minium, MD.FACG 10/04/2023, 10:32 AM Pager [phone] 7am-5pm  Check AMION for 5pm -7am coverage and on weekends

## 2023-10-04 NOTE — Anesthesia Preprocedure Evaluation (Signed)
 Anesthesia Evaluation  Patient identified by MRN, date of birth, ID band Patient awake    Reviewed: Allergy & Precautions, NPO status , Patient's Chart, lab work & pertinent test results  History of Anesthesia Complications Negative for: history of anesthetic complications  Airway Mallampati: III  TM Distance: <3 FB Neck ROM: full    Dental  (+) Chipped, Poor Dentition   Pulmonary sleep apnea    Pulmonary exam normal        Cardiovascular Exercise Tolerance: Good hypertension, (-) angina (-) Past MI and (-) DOE Normal cardiovascular exam     Neuro/Psych  Neuromuscular disease  negative psych ROS   GI/Hepatic Neg liver ROS, hiatal hernia,GERD  Controlled,,  Endo/Other  negative endocrine ROS    Renal/GU Renal disease     Musculoskeletal   Abdominal   Peds  Hematology negative hematology ROS (+)   Anesthesia Other Findings Past Medical History: 2016: BPH (benign prostatic hypertrophy)     Comment:  s/p photovaporization Evelene Croon) 04/2012: Diverticulosis No date: GERD (gastroesophageal reflux disease) 08/1996: Gout 08/1996: History of hiatal hernia     Comment:  Barium Swallow HH, Mod . Size 12/1998: Hyperlipidemia 2014: Nephrolithiasis     Comment:  stent placed, then removed No date: Self-catheterizes urinary bladder     Comment:  2-3 times/day  Past Surgical History: 04/2012: COLONOSCOPY     Comment:  mod diverticulosis, 3 polyps - benign, rec rpt 10 yrs               (Pyrtle) 1994: CYSTECTOMY     Comment:  cystectomy right foot and gluteal area 06/03/2015: CYSTOSCOPY WITH LITHOLAPAXY     Comment:  Procedure: CYSTOSCOPY WITH LITHOLAPAXY WITH HOLMIUM               LASER ;  Surgeon: Orson Ape, MD;  Location: [facility]               ORS;  Service: Urology;; 10/03/2023: ERCP; N/A     Comment:  Procedure: ENDOSCOPIC RETROGRADE               CHOLANGIOPANCREATOGRAPHY (ERCP);  Surgeon: Midge Minium,                MD;  Location: [facility] ENDOSCOPY;  Service: Endoscopy;                Laterality: N/A; 06/03/2015: GREEN LIGHT LASER TURP (TRANSURETHRAL RESECTION OF  PROSTATE; N/A     Comment:  Procedure: GREEN LIGHT LASER TURP (TRANSURETHRAL               RESECTION OF PROSTATE;  Surgeon: Orson Ape, MD;                Location: [facility] ORS;  Service: Urology;  Laterality: N/A; 1999: LITHOTRIPSY 2014: LITHOTRIPSY     Comment:  with stent placement, s/p removal Evelene Croon) 10/03/2023: REMOVAL OF STONES     Comment:  Procedure: REMOVAL OF STONES;  Surgeon: Midge Minium,               MD;  Location: [facility] ENDOSCOPY;  Service: Endoscopy;; 10/03/2023: SPHINCTEROTOMY     Comment:  Procedure: SPHINCTEROTOMY;  Surgeon: Midge Minium, MD;                Location: [facility] ENDOSCOPY;  Service: Endoscopy;; 05/2015: stress echocardiogram     Comment:  overall normal after max exercise, severe hypertensive               response,  EF 60-65% No date: WRIST GANGLION EXCISION  BMI    Body Mass Index: 37.29 kg/m      Reproductive/Obstetrics negative OB ROS                             Anesthesia Physical Anesthesia Plan  ASA: 3  Anesthesia Plan: General ETT   Post-op Pain Management:    Induction: Intravenous  PONV Risk Score and Plan: Ondansetron, Dexamethasone, Midazolam and Treatment may vary due to age or medical condition  Airway Management Planned: Oral ETT  Additional Equipment:   Intra-op Plan:   Post-operative Plan: Extubation in OR  Informed Consent: I have reviewed the patients History and Physical, chart, labs and discussed the procedure including the risks, benefits and alternatives for the proposed anesthesia with the patient or authorized representative who has indicated his/her understanding and acceptance.     Dental Advisory Given  Plan Discussed with: Anesthesiologist, CRNA and Surgeon  Anesthesia Plan Comments: (Patient consented for risks of anesthesia  including but not limited to:  - adverse reactions to medications - damage to eyes, teeth, lips or other oral mucosa - nerve damage due to positioning  - sore throat or hoarseness - Damage to heart, brain, nerves, lungs, other parts of body or loss of life  Patient voiced understanding and assent.)       Anesthesia Quick Evaluation

## 2023-10-04 NOTE — Plan of Care (Signed)

## 2023-10-04 NOTE — Progress Notes (Signed)
  PROGRESS NOTE    Timothy Landry  BJY:[id number] DOB: July 19, 1961 DOA: 09/30/2023 PCP: Eustaquio Boyden, MD  [id number]/[id number]-BB  LOS: 3 days   Brief hospital course:   Assessment & Plan: Timothy Landry is a 63 y.o. male with medical history significant of  HTN, HLD, BPH, gout. He presented to the [facility] ED yesterday evening after his PCP notified him of elevated LFTs on outpatient labs.     # Cholestasis 2/2 #Choledocholithiasis MRCP with filing defects of distal extrahepatic bile duct indicative of small stones or debris. AST/ALT 98/216, Tbili 10.7. Imaging without evidence of cholangitis, or intra-abdominal infection. Afebrile.  No Leukocytosis.  Abx was not started. --ERCP with 2 stones removed Plan: --LAPAROSCOPIC CHOLECYSTECTOMY today   #Hypertension Uncontrolled, BP as high as 152/115 in ED --cont home amlodipine and losartan   #GERD --cont PPI   DVT prophylaxis: Lovenox SQ Code Status: Full code  Family Communication: family updated at bedside today Level of care: Med-Surg Dispo:   The patient is from: home Anticipated d/c is to: home Anticipated d/c date is: tomorrow   Subjective and Interval History:  Pt underwent LAPAROSCOPIC CHOLECYSTECTOMY today.  Tolerated it well.   Objective: Vitals:   10/04/23 1945 10/04/23 1959 10/04/23 2000 10/04/23 2022  BP: 121/75  125/83 131/85  Pulse: 81 90 82 84  Resp: 20 11 (!) 21 20  Temp:  98.4 F (36.9 C)  98.5 F (36.9 C)  TempSrc:    Oral  SpO2: 95% 96% 95% 94%  Weight:      Height:        Intake/Output Summary (Last 24 hours) at 10/04/2023 2037 Last data filed at 10/04/2023 2000 Gross per 24 hour  Intake 980 ml  Output 15 ml  Net 965 ml   Filed Weights   10/01/23 1308 10/04/23 1224  Weight: 108 kg 108 kg    Examination:   Constitutional: NAD, AAOx3 HEENT: conjunctivae and lids normal, EOMI CV: No cyanosis.   RESP: normal respiratory effort, on RA Neuro: II - XII grossly intact.   Psych: Normal mood and  affect.  Appropriate judgement and reason   Data Reviewed: I have personally reviewed labs and imaging studies  Time spent: 25 minutes  Darlin Priestly, MD [facility] If 7PM-7AM, please contact night-coverage 10/04/2023, 8:37 PM

## 2023-10-04 NOTE — Progress Notes (Signed)
 [facility] SURGICAL PROGRESS NOTE  Hospital Day(s): 3.   Interval History:  Patient seen and examined No acute events or new complaints overnight.  Patient reports he is feeling much better No abdominal pain this AM No fever, chills, nausea, emesis  Without leukocytosis; WBC 7.1K Hgb to 14.2 Renal function normal; sCr - 0.78; UO - unmeasured Bilirubinemia to 3.5 (10.7 on admission) He is NPO  Vital signs in last 24 hours: [min-max] current  Temp:  [97.1 F (36.2 C)-98.3 F (36.8 C)] 98.3 F (36.8 C) (02/18 0411) Pulse Rate:  [55-67] 64 (02/18 0411) Resp:  [15-20] 20 (02/18 0411) BP: (124-152)/(82-104) 138/94 (02/18 0411) SpO2:  [93 %-98 %] 98 % (02/18 0411)     Height: 5\' 7"  (170.2 cm) Weight: 108 kg BMI (Calculated): 37.28   Intake/Output last 2 shifts:  02/17 0701 - 02/18 0700 In: 560 [P.O.:360; I.V.:200] Out: -    Physical Exam:  Constitutional: alert, cooperative and no distress  HEENT; Sclera are icteric  Respiratory: breathing non-labored at rest  Cardiovascular: regular rate and sinus rhythm  Gastrointestinal: soft, non-tender, and non-distended Integumentary: warm, dry  Labs:     Latest Ref Rng & Units 10/04/2023    6:10 AM 10/03/2023    6:32 AM 09/30/2023    5:41 PM  CBC  WBC 4.0 - 10.5 K/uL 7.1  5.5  7.0   Hemoglobin 13.0 - 17.0 g/dL 86.5  78.4  69.6   Hematocrit 39.0 - 52.0 % 41.7  45.4  50.0   Platelets 150 - 400 K/uL 192  203  198       Latest Ref Rng & Units 10/04/2023    6:10 AM 10/03/2023    6:32 AM 09/30/2023    5:41 PM  CMP  Glucose 70 - 99 mg/dL 95  295  284   BUN 8 - 23 mg/dL 14  15  17    Creatinine 0.61 - 1.24 mg/dL 1.32  4.40  1.02   Sodium 135 - 145 mmol/L 140  143  137   Potassium 3.5 - 5.1 mmol/L 3.7  4.2  3.6   Chloride 98 - 111 mmol/L 105  107  100   CO2 22 - 32 mmol/L 26  29  23    Calcium 8.9 - 10.3 mg/dL 8.6  9.0  9.0   Total Protein 6.5 - 8.1 g/dL 6.5  6.9  7.9   Total Bilirubin 0.0 - 1.2 mg/dL 3.5  2.4  72.5    Alkaline Phos 38 - 126 U/L 203  148  191   AST 15 - 41 U/L 92  35  98   ALT 0 - 44 U/L 143  98  216      Imaging studies: No new pertinent imaging studies   Assessment/Plan:  63 y.o. male with choledocholithiasis s/p ERCP (02/17)   - Plan for robotic assisted laparoscopic cholecystectomy this afternoon with Dr Maurine Minister pending OR/Anesthesia availability   - All risks, benefits, and alternatives to above procedure(s) were discussed with the patient, all of his questions were answered to his expressed satisfaction, patient expresses he wishes to proceed, and informed consent was obtained.  - NPO for planned procedure   - IV Abx (Cefoxitin) on call to OR - ICG on call to OR - Monitor abdominal examination - Monitor bilirubin   - Pain control prn; antiemetics prn - Mobilize as tolerated - Further management per primary service; we will follow    All of the above findings and recommendations  were discussed with the patient, and the medical team, and all of patient's questions were answered to his expressed satisfaction.  -- Lynden Oxford, PA-C [facility] 10/04/2023, 7:19 AM M-F: 7am - 4pm

## 2023-10-04 NOTE — Op Note (Signed)
 Robotic assisted laparoscopic Cholecystectomy  Pre-operative Diagnosis: Choledocholithiasis  Post-operative Diagnosis: Choledocholithiasis, acute cholecystitis  Procedure:  Robotic assisted laparoscopic Cholecystectomy  Surgeon: Baker Pierini, MD  Anesthesia: Gen. with endotracheal tube  Findings: Inflamed gallbladder  Estimated Blood Loss: 15cc       Specimens: Gallbladder           Complications: none   Procedure Details  The patient was seen again in the Holding Room. The benefits, complications, treatment options, and expected outcomes were discussed with the patient. The risks of bleeding, infection, recurrence of symptoms, failure to resolve symptoms, bile duct damage, bile duct leak, retained common bile duct stone, bowel injury, any of which could require further surgery and/or ERCP, stent, or papillotomy were reviewed with the patient. The likelihood of improving the patient's symptoms with return to their baseline status is good.  The patient and/or family concurred with the proposed plan, giving informed consent.  The patient was taken to Operating Room, identified  and the procedure verified as robotic Cholecystectomy.  A Time Out was held and the above information confirmed.  Prior to the induction of general anesthesia, antibiotic prophylaxis was administered. VTE prophylaxis was in place. General endotracheal anesthesia was then administered and tolerated well. After the induction, the abdomen was prepped with Chloraprep and draped in the sterile fashion. The patient was positioned in the supine position.  A veress needle was inserted into the abdomen using standard drop technique. An 8mm infra-umbilical robotic port was then placed under direct visualization. There was no injury noted at the site of veress needle insertion. Two right sided abdominal 8mm ports followed by an 8mm left abdominal robotic ports were placed under direct visualization. The left sided abdominal  port was then upsized to a 12mm robotic port.  The patient was positioned  in reverse Trendelenburg, robot was brought to the surgical field and docked in the standard fashion.  We made sure all the instrumentation was kept indirect view at all times and that there were no collision between the arms. I scrubbed out and went to the console.  The gallbladder was identified, the fundus grasped and retracted cephalad. Adhesions were lysed bluntly. The infundibulum was grasped and retracted laterally, exposing the peritoneum overlying the triangle of Calot. This was then divided and exposed in a blunt fashion. An extended critical view of the cystic duct and cystic artery was obtained.  The cystic duct was clearly identified and bluntly dissected.   Artery and duct were double clipped and divided. Using ICG cholangiography we visualized the cystic duct. The gallbladder was taken from the gallbladder fossa in a retrograde fashion with the electrocautery.  Hemostasis was achieved with the electrocautery. nspection of the right upper quadrant was performed. No bleeding, bile duct injury or leak, or bowel injury was noted. Robotic instruments and robotic arms were undocked in the standard fashion.  I scrubbed back in.  The gallbladder was removed and placed in an Endocatch bag.   The left lower quadrant fascia was then closed with a 0 vicryl using a suture needle passer. The pre-peritoneal space was then infiltrated with liposomal bupivicaine and marcaine solution. Pneumoperitoneum was released.  4-0 subcuticular Monocryl was used to close the skin. Dermabond was  applied.  The patient was then extubated and brought to the recovery room in stable condition. Sponge, lap, and needle counts were correct at closure and at the conclusion of the case.  Baker Pierini, M.D. [facility]

## 2023-10-04 NOTE — Plan of Care (Signed)
  Problem: Health Behavior/Discharge Planning: Goal: Ability to manage health-related needs will improve Outcome: Progressing   Problem: Clinical Measurements: Goal: Will remain free from infection Outcome: Progressing Goal: Diagnostic test results will improve Outcome: Progressing Goal: Respiratory complications will improve Outcome: Progressing Goal: Cardiovascular complication will be avoided Outcome: Progressing   Problem: Activity: Goal: Risk for activity intolerance will decrease Outcome: Progressing   Problem: Nutrition: Goal: Adequate nutrition will be maintained Outcome: Progressing   Problem: Elimination: Goal: Will not experience complications related to bowel motility Outcome: Progressing Goal: Will not experience complications related to urinary retention Outcome: Progressing   Problem: Pain Managment: Goal: General experience of comfort will improve and/or be controlled Outcome: Progressing   Problem: Safety: Goal: Ability to remain free from injury will improve Outcome: Progressing   Problem: Skin Integrity: Goal: Risk for impaired skin integrity will decrease Outcome: Progressing

## 2023-10-04 NOTE — Transfer of Care (Signed)
 Immediate Anesthesia Transfer of Care Note  Patient: Timothy Landry  Procedure(s) Performed: XI ROBOTIC ASSISTED LAPAROSCOPIC CHOLECYSTECTOMY INDOCYANINE GREEN FLUORESCENCE IMAGING (ICG)  Patient Location: PACU  Anesthesia Type:General  Level of Consciousness: awake, alert , and oriented  Airway & Oxygen Therapy: Patient connected to nasal cannula oxygen  Post-op Assessment: Report given to RN and Post -op Vital signs reviewed and stable  Post vital signs: Reviewed and stable  Last Vitals:  Vitals Value Taken Time  BP 146/88 10/04/23 1902  Temp 36.3 C 10/04/23 1903  Pulse 91 10/04/23 1904  Resp 28 10/04/23 1904  SpO2 94 % 10/04/23 1902  Vitals shown include unfiled device data.  Last Pain:  Vitals:   10/04/23 1902  TempSrc:   PainSc: 0-No pain         Complications: No notable events documented.

## 2023-10-04 NOTE — Anesthesia Procedure Notes (Signed)
 Procedure Name: Intubation Date/Time: 10/04/2023 5:06 PM  Performed by: Mohammed Kindle, CRNAPre-anesthesia Checklist: Patient identified, Emergency Drugs available, Suction available and Patient being monitored Patient Re-evaluated:Patient Re-evaluated prior to induction Oxygen Delivery Method: Circle system utilized Preoxygenation: Pre-oxygenation with 100% oxygen Induction Type: IV induction Ventilation: Mask ventilation without difficulty Laryngoscope Size: McGrath and 3 Grade View: Grade I Tube type: Oral Tube size: 7.0 mm Number of attempts: 1 Airway Equipment and Method: Stylet and Oral airway Placement Confirmation: ETT inserted through vocal cords under direct vision, positive ETCO2 and breath sounds checked- equal and bilateral Secured at: 21 cm Tube secured with: Tape Dental Injury: Teeth and Oropharynx as per pre-operative assessment

## 2023-10-05 DIAGNOSIS — K8050 Calculus of bile duct without cholangitis or cholecystitis without obstruction: Secondary | ICD-10-CM | POA: Diagnosis not present

## 2023-10-05 LAB — COMPREHENSIVE METABOLIC PANEL
ALT: 125 U/L — ABNORMAL HIGH (ref 0–44)
AST: 67 U/L — ABNORMAL HIGH (ref 15–41)
Albumin: 3.5 g/dL (ref 3.5–5.0)
Alkaline Phosphatase: 187 U/L — ABNORMAL HIGH (ref 38–126)
Anion gap: 9 (ref 5–15)
BUN: 16 mg/dL (ref 8–23)
CO2: 26 mmol/L (ref 22–32)
Calcium: 8.7 mg/dL — ABNORMAL LOW (ref 8.9–10.3)
Chloride: 105 mmol/L (ref 98–111)
Creatinine, Ser: 0.89 mg/dL (ref 0.61–1.24)
GFR, Estimated: >60 mL/min (ref >60)
Glucose, Bld: 146 mg/dL — ABNORMAL HIGH (ref 70–99)
Potassium: 4.2 mmol/L (ref 3.5–5.1)
Sodium: 140 mmol/L (ref 135–145)
Total Bilirubin: 2 mg/dL — ABNORMAL HIGH (ref 0.0–1.2)
Total Protein: 7.1 g/dL (ref 6.5–8.1)

## 2023-10-05 LAB — CBC
HCT: 45.4 % (ref 39.0–52.0)
Hemoglobin: 15 g/dL (ref 13.0–17.0)
MCH: 29.7 pg (ref 26.0–34.0)
MCHC: 33 g/dL (ref 30.0–36.0)
MCV: 89.9 fL (ref 80.0–100.0)
Platelets: 216 10*3/uL (ref 150–400)
RBC: 5.05 MIL/uL (ref 4.22–5.81)
RDW: 13.5 % (ref 11.5–15.5)
WBC: 7.8 10*3/uL (ref 4.0–10.5)
nRBC: 0 % (ref 0.0–0.2)

## 2023-10-05 MED ORDER — ACETAMINOPHEN 325 MG PO TABS: 650.0000 mg | ORAL_TABLET | Freq: Four times a day (QID) | ORAL | Status: AC | PRN

## 2023-10-05 MED ORDER — HYDROCODONE-ACETAMINOPHEN 5-325 MG PO TABS: 1.0000 | ORAL_TABLET | ORAL | 0 refills | Status: DC | PRN

## 2023-10-05 NOTE — Progress Notes (Signed)
 Patient s/p robo cholecystectomy for choledocholithiasis and acute cholecystitis. He is doing well. He reports that his pain is minimal. He tolerated [place] sandwich last night without any nausea or vomiting. On exam abdomen is soft, some tenderness on left lower quadrant as expected. Labs reviewed and LFTs as well as bilirubin improving.   Okay from our standpoint for discharge. Discussed discharge instructions with him: okay to shower today and let warm water run over incisions, no submerging the wound for 2 weeks and no lifting greater than 10-15lbs for 4 weeks. He will follow up with [place] in 2 weeks.

## 2023-10-05 NOTE — Discharge Summary (Addendum)
 Physician Discharge Summary   Patient: Timothy Landry MRN: [id number] DOB: 29-Aug-1960  Admit date:     09/30/2023  Discharge date: 10/05/2023  Discharge Physician: Pennie Banter   PCP: Eustaquio Boyden, MD   Recommendations at discharge:    Follow up with General Surgery Follow up with Primary Care Repeat CBC, CMP at follow up  Discharge Diagnoses: Active Problems:   HLD (hyperlipidemia)   Gout   HTN (hypertension)  Principal Problem (Resolved):   Choledocholithiasis Resolved Problems:   Acute cholecystitis  Hospital Course:  "[name] is a 63 y.o. male with medical history significant of  HTN, HLD, BPH, gout. He presented to the [facility] ED yesterday evening after his PCP notified him of elevated LFTs on outpatient labs. "  Further hospital course and management as outlined below.  2/19 -- pt doing well & has no complaints, tolerating diet.  Cleared by surgery for discharge. Pt medically stable and requests discharge home today  Assessment and Plan:  # Cholestasis 2/2 #Choledocholithiasis #Chronic cholecystitis, cholelithiasis - based on pathology results  MRCP with filing defects of distal extrahepatic bile duct indicative of small stones or debris. AST/ALT 98/216, Tbili 10.7. Imaging without evidence of cholangitis, or intra-abdominal infection. Afebrile.  No Leukocytosis.  Abx was not started. --ERCP with 2 stones removed Plan: --LAPAROSCOPIC CHOLECYSTECTOMY 2/18 --Follow up outpatient as scheudled with Surgery   #Hypertension Uncontrolled, BP as high as 152/115 in ED --cont home amlodipine and losartan   #GERD --cont PPI       Consultants: surgery Procedures performed: lap chole  Disposition: Home Diet recommendation:  Regular diet DISCHARGE MEDICATION: Allergies as of 10/05/2023   No Known Allergies      Medication List     TAKE these medications    acetaminophen 325 MG tablet Commonly known as: TYLENOL Take 2 tablets (650 mg total)  by mouth every 6 (six) hours as needed for mild pain (pain score 1-3) (or Fever >/= 101).   allopurinol 300 MG tablet Commonly known as: ZYLOPRIM Take 0.5 tablets (150 mg total) by mouth daily.   amLODipine 5 MG tablet Commonly known as: NORVASC Take 1 tablet (5 mg total) by mouth daily.   fluticasone 50 MCG/ACT nasal spray Commonly known as: FLONASE Place 2 sprays into both nostrils daily.   HYDROcodone-acetaminophen 5-325 MG tablet Commonly known as: NORCO/VICODIN Take 1-2 tablets by mouth every 4 (four) hours as needed for moderate pain (pain score 4-6).   losartan 25 MG tablet Commonly known as: COZAAR Take 1 tablet (25 mg total) by mouth daily.   omeprazole 20 MG tablet Commonly known as: PRILOSEC OTC Take 20 mg by mouth every morning.        Follow-up Information     Donovan Kail, PA-C Follow up on 10/19/2023.   Specialty: Physician Assistant Why: Go at  2:45pm. Contact information: [address] [place] [address] [phone]                Discharge Exam: Ceasar Mons Weights   10/01/23 1308 10/04/23 1224  Weight: 108 kg 108 kg   General exam: awake, alert, no acute distress HEENT: atraumatic, clear conjunctiva, anicteric sclera, moist mucus membranes, hearing grossly normal  Respiratory system: CTA, no wheezes, rales or rhonchi, normal respiratory effort. Cardiovascular system: normal S1/S2, RRR, no JVD, murmurs, rubs, gallops, no pedal edema.   Gastrointestinal system: soft, NT, ND, lap chole incisions with glue in place Central nervous system: A&O x 4. no gross focal  neurologic deficits, normal speech Extremities: moves all, no edema, normal tone Skin: dry, intact, normal temperature, normal color, No rashes, lesions or ulcers Psychiatry: normal mood, congruent affect, judgement and insight appear normal   Condition at discharge: stable  The results of significant diagnostics from this hospitalization (including imaging,  microbiology, ancillary and laboratory) are listed below for reference.   Imaging Studies: DG C-Arm 1-60 Min-No Report Result Date: 10/03/2023 Fluoroscopy was utilized by the requesting physician.  No radiographic interpretation.   MR ABDOMEN MRCP W WO CONTAST Result Date: 10/01/2023 CLINICAL DATA:  Elevated liver function tests EXAM: MRI ABDOMEN WITHOUT AND WITH CONTRAST (INCLUDING MRCP) TECHNIQUE: Multiplanar multisequence MR imaging of the abdomen was performed both before and after the administration of intravenous contrast. Heavily T2-weighted images of the biliary and pancreatic ducts were obtained, and three-dimensional MRCP images were rendered by post processing. CONTRAST:  10mL GADAVIST GADOBUTROL 1 MMOL/ML IV SOLN COMPARISON:  None Available. FINDINGS: Lower chest: No acute findings. Hepatobiliary: Hepatic parenchyma demonstrates normal signal intensity. No focal intrahepatic masses are identified. No intrahepatic biliary ductal dilation. The portal vein hepatic arteries, and hepatic veins are patent. The gallbladder is contracted. Mild edema of the gallbladder wall noted, however, no superimposed pericholecystic inflammatory changes are identified. There are tiny gallstones identified within the gallbladder fundus as well as a single gallstone seen within the gallbladder neck. The extrahepatic bile duct is not dilated. Tiny filling defects are identified within the distal duct measuring up to 2 mm in size in keeping with tiny stones or debris and in keeping with choledocholithiasis. Pancreas: Normal parenchymal signal characteristics. No pancreatic mass identified. Scattered side branch ectasia noted within the uncinate process and body of the pancreas possibly the sequela remote inflammation. No superimposed peripancreatic inflammatory changes are identified. No peripancreatic fluid collections or lymphadenopathy. Spleen:  Within normal limits in size and appearance. Adrenals/Urinary Tract: The  adrenal glands are unremarkable. The kidneys are normal in size and position. Mild bilateral nonspecific perinephric edema. Simple cortical cyst noted within the lower pole the right kidney for which no follow-up imaging is recommended. The kidneys are otherwise unremarkable. Stomach/Bowel: Visualized portions within the abdomen are unremarkable. Vascular/Lymphatic: No pathologically enlarged lymph nodes identified. No abdominal aortic aneurysm demonstrated. Other:  None. Musculoskeletal: No suspicious bone lesions identified. IMPRESSION: 1. Cholelithiasis. Mild edema of the gallbladder wall, without superimposed pericholecystic inflammatory change. 2. Tiny filling defects within the distal extrahepatic bile duct measuring up to 2 mm in size in keeping with tiny stones or debris and in keeping with choledocholithiasis. No associated biliary ductal dilation. 3. Scattered side branch ectasia within the uncinate process and body of the pancreas possibly the sequela remote inflammation. No superimposed peripancreatic inflammatory changes are identified. Electronically Signed   By: Helyn Numbers M.D.   On: 10/01/2023 03:47   MR 3D Recon At Scanner Result Date: 10/01/2023 CLINICAL DATA:  Elevated liver function tests EXAM: MRI ABDOMEN WITHOUT AND WITH CONTRAST (INCLUDING MRCP) TECHNIQUE: Multiplanar multisequence MR imaging of the abdomen was performed both before and after the administration of intravenous contrast. Heavily T2-weighted images of the biliary and pancreatic ducts were obtained, and three-dimensional MRCP images were rendered by post processing. CONTRAST:  10mL GADAVIST GADOBUTROL 1 MMOL/ML IV SOLN COMPARISON:  None Available. FINDINGS: Lower chest: No acute findings. Hepatobiliary: Hepatic parenchyma demonstrates normal signal intensity. No focal intrahepatic masses are identified. No intrahepatic biliary ductal dilation. The portal vein hepatic arteries, and hepatic veins are patent. The gallbladder  is contracted. Mild edema  of the gallbladder wall noted, however, no superimposed pericholecystic inflammatory changes are identified. There are tiny gallstones identified within the gallbladder fundus as well as a single gallstone seen within the gallbladder neck. The extrahepatic bile duct is not dilated. Tiny filling defects are identified within the distal duct measuring up to 2 mm in size in keeping with tiny stones or debris and in keeping with choledocholithiasis. Pancreas: Normal parenchymal signal characteristics. No pancreatic mass identified. Scattered side branch ectasia noted within the uncinate process and body of the pancreas possibly the sequela remote inflammation. No superimposed peripancreatic inflammatory changes are identified. No peripancreatic fluid collections or lymphadenopathy. Spleen:  Within normal limits in size and appearance. Adrenals/Urinary Tract: The adrenal glands are unremarkable. The kidneys are normal in size and position. Mild bilateral nonspecific perinephric edema. Simple cortical cyst noted within the lower pole the right kidney for which no follow-up imaging is recommended. The kidneys are otherwise unremarkable. Stomach/Bowel: Visualized portions within the abdomen are unremarkable. Vascular/Lymphatic: No pathologically enlarged lymph nodes identified. No abdominal aortic aneurysm demonstrated. Other:  None. Musculoskeletal: No suspicious bone lesions identified. IMPRESSION: 1. Cholelithiasis. Mild edema of the gallbladder wall, without superimposed pericholecystic inflammatory change. 2. Tiny filling defects within the distal extrahepatic bile duct measuring up to 2 mm in size in keeping with tiny stones or debris and in keeping with choledocholithiasis. No associated biliary ductal dilation. 3. Scattered side branch ectasia within the uncinate process and body of the pancreas possibly the sequela remote inflammation. No superimposed peripancreatic inflammatory changes  are identified. Electronically Signed   By: Helyn Numbers M.D.   On: 10/01/2023 03:47   CT ABDOMEN PELVIS W CONTRAST Result Date: 09/30/2023 CLINICAL DATA:  Abdominal pain, acute, nonlocalized EXAM: CT ABDOMEN AND PELVIS WITH CONTRAST TECHNIQUE: Multidetector CT imaging of the abdomen and pelvis was performed using the standard protocol following bolus administration of intravenous contrast. RADIATION DOSE REDUCTION: This exam was performed according to the departmental dose-optimization program which includes automated exposure control, adjustment of the mA and/or kV according to patient size and/or use of iterative reconstruction technique. CONTRAST:  OMNIPAQUE IOHEXOL 300 MG/ML  SOLN COMPARISON:  Ultrasound abdomen 09/30/2023 trauma CT abdomen pelvis 12/13/2012 FINDINGS: Lower chest: No acute abnormality.  Coronary artery calcification. Hepatobiliary: Diffusely hypodense hepatic parenchyma compared to the spleen consistent with hepatic steatosis. No focal lesion. Partially contracted gallbladder with mild gallbladder wall thickening. No CT evidence of radiopaque gallstones or pericholecystic fluid. No biliary dilatation. Pancreas: No focal lesion. Normal pancreatic contour. No surrounding inflammatory changes. No main pancreatic ductal dilatation. Spleen: Normal in size without focal abnormality. Adrenals/Urinary Tract: No adrenal nodule bilaterally. Bilateral kidneys enhance symmetrically. Fluid dense lesion likely represents a simple renal cyst. Simple renal cysts, in the absence of clinically indicated signs/symptoms, require no independent follow-up. No hydronephrosis. No hydroureter.  No nephroureterolithiasis. Mild circumferential urinary bladder wall thickening with query mild perivesicular fat stranding. On delayed imaging, there is no urothelial wall thickening and there are no filling defects in the opacified portions of the bilateral collecting systems or ureters. Stomach/Bowel: Stomach is  within normal limits. No evidence of bowel wall thickening or dilatation. Colonic diverticulosis. Appendix appears normal. Vascular/Lymphatic: No abdominal aorta or iliac aneurysm. Moderate atherosclerotic plaque of the aorta and its branches. No abdominal, pelvic, or inguinal lymphadenopathy. Reproductive: Prostate is unremarkable. Other: No intraperitoneal free fluid. No intraperitoneal free gas. No organized fluid collection. Musculoskeletal: No abdominal wall hernia or abnormality. No suspicious lytic or blastic osseous lesions. No acute displaced  fracture. Multilevel degenerative changes of the spine. Diffusely decreased bone density. IMPRESSION: 1. Question mild cystitis.  Correlate with urinalysis. 2. Nonspecific mild gallbladder wall thickening possibly due to partial gallbladder contraction. Correlate with ultrasound abdomen 09/30/2023. 3. Hepatic steatosis. 4. Colonic diverticulosis with no acute diverticulitis. 5. Diffusely decreased bone density. Electronically Signed   By: Tish Frederickson M.D.   On: 09/30/2023 20:03   US Abdomen Limited RUQ (LIVER/GB) Result Date: 09/30/2023 CLINICAL DATA:  161096 Liver disorder 100251. Elevated liver function tests. Jaundice. Fatigue EXAM: ULTRASOUND ABDOMEN LIMITED RIGHT UPPER QUADRANT COMPARISON:  CT abdomen pelvis 09/29/2022, CT stone 12/13/2012 FINDINGS: Gallbladder: Question partially contracted gallbladder. Cholelithiasis with gallbladder wall thickening. No pericholecystic visualized. No sonographic Murphy sign noted by sonographer. Common bile duct: Diameter: 6 mm Liver: No focal lesion identified. Increased parenchymal echogenicity. Portal vein is patent on color Doppler imaging with normal direction of blood flow towards the liver. Other: None. IMPRESSION: 1. Cholelithiasis with nonspecific gallbladder wall thickening. Gallbladder thickening may be due to partially contracted gallbladder. 2. Hepatic steatosis. Please note limited evaluation for focal  hepatic masses in a patient with hepatic steatosis due to decreased penetration of the acoustic ultrasound waves. Electronically Signed   By: Tish Frederickson M.D.   On: 09/30/2023 19:57    Microbiology: No results found for this or any previous visit.  Labs: CBC: Recent Labs  Lab 10/03/23 0632 10/04/23 0610 10/05/23 0552  WBC 5.5 7.1 7.8  HGB 14.8 14.2 15.0  HCT 45.4 41.7 45.4  MCV 90.8 89.9 89.9  PLT 203 192 216   Basic Metabolic Panel: Recent Labs  Lab 10/03/23 0632 10/04/23 0610 10/05/23 0552  NA 143 140 140  K 4.2 3.7 4.2  CL 107 105 105  CO2 29 26 26   GLUCOSE 100* 95 146*  BUN 15 14 16   CREATININE 0.98 0.78 0.89  CALCIUM 9.0 8.6* 8.7*  MG 2.3  --   --    Liver Function Tests: Recent Labs  Lab 10/03/23 0632 10/04/23 0610 10/05/23 0552  AST 35 92* 67*  ALT 98* 143* 125*  ALKPHOS 148* 203* 187*  BILITOT 2.4* 3.5* 2.0*  PROT 6.9 6.5 7.1  ALBUMIN 3.3* 3.2* 3.5   CBG: No results for input(s): "GLUCAP" in the last 168 hours.  Discharge time spent: less than 30 minutes.  Signed: Pennie Banter, DO [facility] 10/08/2023

## 2023-10-05 NOTE — Plan of Care (Signed)

## 2023-10-06 LAB — SURGICAL PATHOLOGY

## 2023-10-08 ENCOUNTER — Encounter: Payer: Self-pay | Admitting: Family Medicine

## 2023-10-08 DIAGNOSIS — K810 Acute cholecystitis: Secondary | ICD-10-CM

## 2023-10-08 HISTORY — DX: Acute cholecystitis: K81.0

## 2023-10-19 ENCOUNTER — Ambulatory Visit (INDEPENDENT_AMBULATORY_CARE_PROVIDER_SITE_OTHER): Payer: PRIVATE HEALTH INSURANCE | Admitting: Physician Assistant

## 2023-10-19 ENCOUNTER — Encounter: Payer: Self-pay | Admitting: Physician Assistant

## 2023-10-19 VITALS — BP 150/98 | HR 66 | Temp 97.9°F | Ht 67.5 in | Wt 230.2 lb

## 2023-10-19 DIAGNOSIS — Z09 Encounter for follow-up examination after completed treatment for conditions other than malignant neoplasm: Secondary | ICD-10-CM

## 2023-10-19 DIAGNOSIS — K8062 Calculus of gallbladder and bile duct with acute cholecystitis without obstruction: Secondary | ICD-10-CM

## 2023-10-19 NOTE — Progress Notes (Signed)
 [facility] POST-OP OFFICE VISIT  10/19/2023  HPI: Timothy Landry is a 63 y.o. male 15 days s/p robotic assisted laparoscopic cholecystectomy for choledocholithiasis with Dr Maurine Minister   He has done well Sore for a few days; only needed three of his pain pills Now minimal soreness No fever, chills, nausea, emesis Tolerating PO; being mindful - no diarrhea Incisions are well healed No there complaints   Vital signs: BP (!) 150/98   Pulse 66   Temp 97.9 F (36.6 C) (Oral)   Ht 5' 7.5" (1.715 m)   Wt 230 lb 3.2 oz (104.4 kg)   SpO2 96%   BMI 35.52 kg/m    Physical Exam: Constitutional: Well appearing male, NAD Abdomen: Soft, non-tender, non-distended, no rebound/guarding Skin: Laparoscopic incisions are healing well, no erythema or drainage   Assessment/Plan: This is a 63 y.o. male 15 days s/p robotic assisted laparoscopic cholecystectomy for choledocholithiasis with Dr Maurine Minister    - Pain control prn  - Reviewed wound care recommendation  - Reviewed lifting restrictions; 4 weeks total  - Reviewed surgical pathology; CCC  - He can follow up on as needed basis; He understands to call with questions/concerns  -- Lynden Oxford, PA-C [facility] 10/19/2023, 2:47 PM M-F: 7am - 4pm

## 2023-10-19 NOTE — Patient Instructions (Signed)

## 2024-01-13 ENCOUNTER — Telehealth: Payer: Self-pay | Admitting: Family Medicine

## 2024-01-13 NOTE — Telephone Encounter (Signed)
 Noted thank you

## 2024-01-13 NOTE — Telephone Encounter (Signed)
 Reviewing chart, has had several elevated BP readings despite current regimen of amlodipine  5mg  and losartan  25mg  daily.  His next appointment is not until August. Please call-has he been checking blood pressures at home and if so what kind of readings is he getting?  If staying consistently elevated >140/90, low threshold to increase losartan  dose prior to next visit.

## 2024-01-13 NOTE — Telephone Encounter (Signed)
 Spoke with pt relaying Dr Ocie Belt message and asked about recent home BP readings. Pt states his readings have been averaging 120s/70s after stopping meloxicam  around Aug/Sept 2024.

## 2024-02-22 ENCOUNTER — Other Ambulatory Visit: Payer: Self-pay | Admitting: Family Medicine

## 2024-02-22 DIAGNOSIS — M1A09X0 Idiopathic chronic gout, multiple sites, without tophus (tophi): Secondary | ICD-10-CM

## 2024-02-22 DIAGNOSIS — I10 Essential (primary) hypertension: Secondary | ICD-10-CM

## 2024-03-15 ENCOUNTER — Other Ambulatory Visit: Payer: Self-pay | Admitting: Family Medicine

## 2024-04-03 ENCOUNTER — Encounter: Payer: Self-pay | Admitting: Family Medicine

## 2024-04-03 ENCOUNTER — Ambulatory Visit (INDEPENDENT_AMBULATORY_CARE_PROVIDER_SITE_OTHER)
Admission: RE | Admit: 2024-04-03 | Discharge: 2024-04-03 | Disposition: A | Discharge status: Home / Self Care | Payer: PRIVATE HEALTH INSURANCE | Source: Ambulatory Visit | Attending: Family Medicine | Admitting: Family Medicine

## 2024-04-03 ENCOUNTER — Ambulatory Visit (INDEPENDENT_AMBULATORY_CARE_PROVIDER_SITE_OTHER): Payer: PRIVATE HEALTH INSURANCE | Admitting: Family Medicine

## 2024-04-03 VITALS — BP 118/62 | HR 55 | Temp 98.4°F | Ht 68.5 in | Wt 226.0 lb

## 2024-04-03 DIAGNOSIS — N400 Enlarged prostate without lower urinary tract symptoms: Secondary | ICD-10-CM | POA: Diagnosis not present

## 2024-04-03 DIAGNOSIS — Z7729 Contact with and (suspected ) exposure to other hazardous substances: Secondary | ICD-10-CM

## 2024-04-03 DIAGNOSIS — Z1212 Encounter for screening for malignant neoplasm of rectum: Secondary | ICD-10-CM

## 2024-04-03 DIAGNOSIS — E7800 Pure hypercholesterolemia, unspecified: Secondary | ICD-10-CM | POA: Diagnosis not present

## 2024-04-03 DIAGNOSIS — I10 Essential (primary) hypertension: Secondary | ICD-10-CM

## 2024-04-03 DIAGNOSIS — Z1211 Encounter for screening for malignant neoplasm of colon: Secondary | ICD-10-CM

## 2024-04-03 DIAGNOSIS — Z0000 Encounter for general adult medical examination without abnormal findings: Secondary | ICD-10-CM | POA: Diagnosis not present

## 2024-04-03 DIAGNOSIS — M1A09X0 Idiopathic chronic gout, multiple sites, without tophus (tophi): Secondary | ICD-10-CM

## 2024-04-03 DIAGNOSIS — K449 Diaphragmatic hernia without obstruction or gangrene: Secondary | ICD-10-CM

## 2024-04-03 DIAGNOSIS — Z23 Encounter for immunization: Secondary | ICD-10-CM

## 2024-04-03 DIAGNOSIS — K219 Gastro-esophageal reflux disease without esophagitis: Secondary | ICD-10-CM

## 2024-04-03 DIAGNOSIS — E66811 Obesity, class 1: Secondary | ICD-10-CM

## 2024-04-03 LAB — COMPREHENSIVE METABOLIC PANEL WITH GFR
ALT: 17 U/L (ref 0–53)
AST: 16 U/L (ref 0–37)
Albumin: 4.4 g/dL (ref 3.5–5.2)
Alkaline Phosphatase: 63 U/L (ref 39–117)
BUN: 25 mg/dL — ABNORMAL HIGH (ref 6–23)
CO2: 32 meq/L (ref 19–32)
Calcium: 9.1 mg/dL (ref 8.4–10.5)
Chloride: 106 meq/L (ref 96–112)
Creatinine, Ser: 1.05 mg/dL (ref 0.40–1.50)
GFR: 75.11 mL/min (ref >60.00)
Glucose, Bld: 95 mg/dL (ref 70–99)
Potassium: 4.4 meq/L (ref 3.5–5.1)
Sodium: 145 meq/L (ref 135–145)
Total Bilirubin: 0.7 mg/dL (ref 0.2–1.2)
Total Protein: 7 g/dL (ref 6.0–8.3)

## 2024-04-03 LAB — LIPID PANEL
Cholesterol: 162 mg/dL (ref 0–200)
HDL: 38 mg/dL — ABNORMAL LOW (ref >39.00)
LDL Cholesterol: 103 mg/dL — ABNORMAL HIGH (ref 0–99)
NonHDL: 123.81
Total CHOL/HDL Ratio: 4
Triglycerides: 105 mg/dL (ref 0.0–149.0)
VLDL: 21 mg/dL (ref 0.0–40.0)

## 2024-04-03 LAB — PSA: PSA: 0.31 ng/mL (ref 0.10–4.00)

## 2024-04-03 LAB — URIC ACID: Uric Acid, Serum: 5.4 mg/dL (ref 4.0–7.8)

## 2024-04-03 MED ORDER — ALLOPURINOL 300 MG PO TABS: 150.0000 mg | ORAL_TABLET | Freq: Every day | ORAL | 3 refills | Status: AC

## 2024-04-03 MED ORDER — AMLODIPINE BESYLATE 5 MG PO TABS: 5.0000 mg | ORAL_TABLET | Freq: Every day | ORAL | 3 refills | Status: AC

## 2024-04-03 MED ORDER — LOSARTAN POTASSIUM 25 MG PO TABS: 25.0000 mg | ORAL_TABLET | Freq: Every day | ORAL | 3 refills | Status: AC

## 2024-04-03 NOTE — Progress Notes (Signed)
 Ph: [phone] Fax: [phone]   Patient ID: Timothy Landry, male    DOB: 10-23-1960, 63 y.o.   MRN: [id number]  This visit was conducted in person.  BP 118/62   Pulse (!) 55   Temp 98.4 F (36.9 C) (Oral)   Ht 5' 8.5 (1.74 m)   Wt 226 lb (102.5 kg)   SpO2 97%   BMI 33.86 kg/m    CC: CPE Subjective:   HPI: Timothy Landry is a 63 y.o. male presenting on 04/03/2024 for Annual Exam   Retired from [employer] 2023 ([employer] [place]) - was around silica dust 39 yrs exposure - received chest xray once a year at work - overdue for this. He did use respirator. Since retiring notes nasal congestion has improved - has stopped flonase  use.    Wife has melanoma with met to brain/lung was on keytruda - had brain surgery 2021. She continues doing well.    HTN - on losartan  25mg  and amlodipine  5mg  daily with good BP control at home - 130-140s/70s.   Choledocholithiasis s/p hospitalization with lap cholecystectomy 09/2023. Occ diet dependent post-cholecystectomy diarrhea. Discussed bile salt binders.   Preventative: COLONOSCOPY 04/2012 mod diverticulosis, 3 polyps - benign, rec rpt 10 yrs ([facility]) - wants to wait until 11/2024 with new insurance for colonoscopy. Agrees to iFOB this year.  Prostate cancer screening - s/p green laser TURP by Dr Kassie 2016 - released from urology care. Yearly PSA at our office.  Lung cancer screening - not eligible  Flu shot yearly -missed last year COVID vaccine Pfizer 10/2019, 11/2019, booster 07/2020  Td 1999, 2009, Tdap 08/2018 Prevnar-20 today  Shingrix  - 12/2020, 02/2023 Seat belt use discussed.  Sunscreen use dicussed. No changing moles on skin.  Never smoker. Chewing tobacco - cutting back.  Alcohol - rare  Dentist - no dental insurance. Brushes teeth and flosses.  Eye exam yearly    Caffeine: 1 mountain dew/day  Lives with wife, mother in law, 1 dog - americ[employer] bully Occupation: works at [employer]  Activity: no regular exercise, some  yardwork, planning to go to [employer]  Diet: some water, good fruits and vegetables     Relevant past medical, surgical, family and social history reviewed and updated as indicated. Interim medical history since our last visit reviewed. Allergies and medications reviewed and updated. Outpatient Medications Prior to Visit  Medication Sig Dispense Refill   acetaminophen  (TYLENOL ) 325 MG tablet Take 2 tablets (650 mg total) by mouth every 6 (six) hours as needed for mild pain (pain score 1-3) (or Fever >/= 101).     omeprazole (PRILOSEC OTC) 20 MG tablet Take 20 mg by mouth every morning.      allopurinol  (ZYLOPRIM ) 300 MG tablet TAKE 0.5 TABLETS BY MOUTH DAILY. 45 tablet 0   amLODipine  (NORVASC ) 5 MG tablet TAKE 1 TABLET (5 MG TOTAL) BY MOUTH DAILY. 90 tablet 0   losartan  (COZAAR ) 25 MG tablet TAKE 1 TABLET (25 MG TOTAL) BY MOUTH DAILY. 30 tablet 3   fluticasone  (FLONASE ) 50 MCG/ACT nasal spray Place 2 sprays into both nostrils daily. (Patient not taking: Reported on 04/03/2024) 16 g 6   No facility-administered medications prior to visit.     Per HPI unless specifically indicated in ROS section below Review of Systems  Constitutional:  Negative for activity change, appetite change, chills, fatigue, fever and unexpected weight change.  HENT:  Negative for hearing loss.   Eyes:  Negative for visual disturbance.  Respiratory:  Negative for cough, chest tightness, shortness of breath and wheezing.   Cardiovascular:  Negative for chest pain, palpitations and leg swelling.  Gastrointestinal:  Positive for abdominal pain (occ). Negative for abdominal distention, blood in stool, constipation, diarrhea, nausea and vomiting.  Genitourinary:  Negative for difficulty urinating and hematuria.  Musculoskeletal:  Negative for arthralgias, myalgias and neck pain.  Skin:  Negative for rash.  Neurological:  Negative for dizziness, seizures, syncope and headaches.  Hematological:  Negative for adenopathy. Does not  bruise/bleed easily.  Psychiatric/Behavioral:  Negative for dysphoric mood. The patient is not nervous/anxious.     Objective:  BP 118/62   Pulse (!) 55   Temp 98.4 F (36.9 C) (Oral)   Ht 5' 8.5 (1.74 m)   Wt 226 lb (102.5 kg)   SpO2 97%   BMI 33.86 kg/m   Wt Readings from Last 3 Encounters:  04/03/24 226 lb (102.5 kg)  10/19/23 230 lb 3.2 oz (104.4 kg)  10/04/23 238 lb 1.6 oz (108 kg)      Physical Exam Vitals and nursing note reviewed.  Constitutional:      General: He is not in acute distress.    Appearance: Normal appearance. He is well-developed. He is not ill-appearing.  HENT:     Head: Normocephalic and atraumatic.     Right Ear: Hearing, tympanic membrane, ear canal and external ear normal.     Left Ear: Hearing, tympanic membrane, ear canal and external ear normal.     Mouth/Throat:     Mouth: Mucous membranes are moist.     Pharynx: Oropharynx is clear. No oropharyngeal exudate or posterior oropharyngeal erythema.  Eyes:     General: No scleral icterus.    Extraocular Movements: Extraocular movements intact.     Conjunctiva/sclera: Conjunctivae normal.     Pupils: Pupils are equal, round, and reactive to light.  Neck:     Thyroid : No thyroid  mass or thyromegaly.     Vascular: No carotid bruit.  Cardiovascular:     Rate and Rhythm: Normal rate and regular rhythm.     Pulses: Normal pulses.          Radial pulses are 2+ on the right side and 2+ on the left side.     Heart sounds: Normal heart sounds. No murmur heard. Pulmonary:     Effort: Pulmonary effort is normal. No respiratory distress.     Breath sounds: Normal breath sounds. No wheezing, rhonchi or rales.  Abdominal:     General: Bowel sounds are normal. There is no distension.     Palpations: Abdomen is soft. There is no mass.     Tenderness: There is no abdominal tenderness. There is no guarding or rebound.     Hernia: No hernia is present.  Musculoskeletal:        General: Normal range of  motion.     Cervical back: Normal range of motion and neck supple.     Right lower leg: No edema.     Left lower leg: No edema.  Lymphadenopathy:     Cervical: No cervical adenopathy.  Skin:    General: Skin is warm and dry.     Findings: No rash.  Neurological:     General: No focal deficit present.     Mental Status: He is alert and oriented to person, place, and time.  Psychiatric:        Mood and Affect: Mood normal.        Behavior:  Behavior normal.        Thought Content: Thought content normal.        Judgment: Judgment normal.       Lab Results  Component Value Date   CHOL 184 02/18/2023   HDL 34.80 (L) 02/18/2023   LDLCALC 124 (H) 02/18/2023   TRIG 125.0 02/18/2023   CHOLHDL 5 02/18/2023    Lab Results  Component Value Date   PSA 0.29 02/18/2023   PSA 0.22 12/11/2020   PSA 0.26 09/19/2019   Assessment & Plan:   Problem List Items Addressed This Visit     Healthcare maintenance - Primary (Chronic)   Preventative protocols reviewed and updated unless pt declined. Discussed healthy diet and lifestyle.       HLD (hyperlipidemia)   Chronic off medications. Update FLP.  Previously declined cardiac CT with calcium score.  The 10-year ASCVD risk score (Arnett DK, et al., 2019) is: 14%   Values used to calculate the score:     Age: 5 years     Clincally relevant sex: Male     Is Non-Hispanic African American: No     Diabetic: No     Tobacco smoker: No     Systolic Blood Pressure: 118 mmHg     Is BP treated: Yes     HDL Cholesterol: 34.8 mg/dL     Total Cholesterol: 184 mg/dL       Relevant Medications   amLODipine  (NORVASC ) 5 MG tablet   losartan  (COZAAR ) 25 MG tablet   Other Relevant Orders   Lipid panel   Comprehensive metabolic panel with GFR   Gout   Chronic, stable on low dose allopurinol  without recent gout flare. Update urate levels       Relevant Medications   allopurinol  (ZYLOPRIM ) 300 MG tablet   Other Relevant Orders   Uric acid    Hiatal hernia with GERD   Diet related - manages with daily OTC PPI       Benign prostatic hyperplasia   H/o photovaporization [place]) remotely. Update PSA.       Relevant Orders   PSA   HTN (hypertension)   Chronic, stable. Continue current regimen.       Relevant Medications   amLODipine  (NORVASC ) 5 MG tablet   losartan  (COZAAR ) 25 MG tablet   Exposure to silica   Update CXR today.       Relevant Orders   DG Chest 2 View   Obesity, Class I, BMI 30-34.9   Encouraged healthy diet and lifestyle changes to effect sustainable weight loss.        Other Visit Diagnoses       Screening for colorectal cancer       Relevant Orders   Fecal occult blood, imunochemical     Need for vaccination against Streptococcus pneumoniae       Relevant Orders   Pneumococcal conjugate vaccine 20-valent (Prevnar 20) (Completed)        Meds ordered this encounter  Medications   allopurinol  (ZYLOPRIM ) 300 MG tablet    Sig: Take 0.5 tablets (150 mg total) by mouth daily.    Dispense:  45 tablet    Refill:  3   amLODipine  (NORVASC ) 5 MG tablet    Sig: Take 1 tablet (5 mg total) by mouth daily.    Dispense:  90 tablet    Refill:  3   losartan  (COZAAR ) 25 MG tablet    Sig: Take 1 tablet (25 mg total) by mouth daily.  Dispense:  90 tablet    Refill:  3    Orders Placed This Encounter  Procedures   Fecal occult blood, imunochemical    Standing Status:   Future    Expiration Date:   04/03/2025   DG Chest 2 View    Standing Status:   Future    Number of Occurrences:   1    Expiration Date:   04/03/2025    Reason for Exam (SYMPTOM  OR DIAGNOSIS REQUIRED):   h/o silica exposure    Preferred imaging location?:   [facility]   Pneumococcal conjugate vaccine 20-valent (Prevnar 20)   Lipid panel   Comprehensive metabolic panel with GFR   PSA   Uric acid    Patient Instructions  Pneumonia shot today (Prevnar-20)  Labs today Medicines refilled You are doing well  Work on  regular exercise routine  Good to see you today  Return as needed or in 1 year for next physical or for welcome to medicare visit   Follow up plan: Return in about 1 year (around 04/03/2025) for annual exam, prior fasting for blood work.  Anton Blas, MD

## 2024-04-03 NOTE — Assessment & Plan Note (Addendum)
 Encouraged healthy diet and lifestyle changes to effect sustainable weight loss.

## 2024-04-03 NOTE — Assessment & Plan Note (Signed)
 Chronic, stable. Continue current regimen.

## 2024-04-03 NOTE — Assessment & Plan Note (Signed)
 H/o photovaporization [place]) remotely. Update PSA.

## 2024-04-03 NOTE — Assessment & Plan Note (Addendum)
 Chronic off medications. Update FLP.  Previously declined cardiac CT with calcium score.  The 10-year ASCVD risk score (Arnett DK, et al., 2019) is: 14%   Values used to calculate the score:     Age: 63 years     Clincally relevant sex: Male     Is Non-Hispanic African American: No     Diabetic: No     Tobacco smoker: No     Systolic Blood Pressure: 118 mmHg     Is BP treated: Yes     HDL Cholesterol: 34.8 mg/dL     Total Cholesterol: 184 mg/dL

## 2024-04-03 NOTE — Assessment & Plan Note (Signed)
 Chronic, stable on low dose allopurinol  without recent gout flare. Update urate levels

## 2024-04-03 NOTE — Patient Instructions (Addendum)
 Pneumonia shot today (Prevnar-20)  Labs today Medicines refilled You are doing well  Work on regular exercise routine  Good to see you today  Return as needed or in 1 year for next physical or for welcome to medicare visit

## 2024-04-03 NOTE — Assessment & Plan Note (Addendum)
 Diet related - manages with daily OTC PPI

## 2024-04-03 NOTE — Assessment & Plan Note (Signed)
 Preventative protocols reviewed and updated unless pt declined. Discussed healthy diet and lifestyle.

## 2024-04-03 NOTE — Assessment & Plan Note (Signed)
Update CXR today.

## 2024-04-07 ENCOUNTER — Ambulatory Visit: Payer: Self-pay | Admitting: Family Medicine
